# Patient Record
Sex: Female | Born: 1985 | Race: White | Hispanic: Yes | Smoking: Former smoker
Health system: Southern US, Community
[De-identification: ages and names within clinical notes are randomized; demographics above are authoritative.]

## PROBLEM LIST (undated history)

## (undated) ENCOUNTER — Emergency Department (HOSPITAL_COMMUNITY): Admission: EM | Payer: Self-pay

## (undated) DIAGNOSIS — R87629 Unspecified abnormal cytological findings in specimens from vagina: Secondary | ICD-10-CM

## (undated) HISTORY — PX: ECTOPIC PREGNANCY SURGERY: SHX613

## (undated) HISTORY — DX: Unspecified abnormal cytological findings in specimens from vagina: R87.629

---

## 2008-04-14 ENCOUNTER — Ambulatory Visit: Payer: Self-pay | Admitting: Internal Medicine

## 2008-08-03 ENCOUNTER — Emergency Department: Payer: Self-pay | Admitting: Emergency Medicine

## 2013-12-19 NOTE — L&D Delivery Note (Signed)
Delivery Note At 1:47 AM a viable female was delivered via Vaginal, Spontaneous Delivery (Presentation: Right Occiput Anterior).  APGAR: 8, ; weight pending .   Placenta status: Intact, Spontaneous.  Cord: 3 vessels with the following complications: None.  Cord pH: n/a  Anesthesia: Epidural  Episiotomy: None Lacerations: None Est. Blood Loss (mL): 250  Mom to postpartum.  Baby to Couplet care / Skin to Skin.  Jacquiline Doearker, Caleb 10/01/2014, 2:27 AM  Patient is 28 y.o. [id number] 6228w1d admitted with SOL  I was gloved and present for delivery in its entirety.  Second stage of labor progressed, baby delivered after few contractions.  Marked variability with few decels during second stage noted.  Complications: none  Lacerations: none  EBL: 250mL  Shyheem Whitham ROCIO, MD 2:28 AM

## 2014-01-20 ENCOUNTER — Ambulatory Visit (HOSPITAL_COMMUNITY)
Admission: RE | Admit: 2014-01-20 | Discharge: 2014-01-20 | Disposition: A | Discharge status: Home / Self Care | Payer: Self-pay | Source: Ambulatory Visit | Attending: Family Medicine | Admitting: Family Medicine

## 2014-01-20 ENCOUNTER — Other Ambulatory Visit (HOSPITAL_COMMUNITY): Payer: Self-pay | Admitting: Family Medicine

## 2014-01-20 DIAGNOSIS — O009 Unspecified ectopic pregnancy without intrauterine pregnancy: Secondary | ICD-10-CM

## 2014-01-20 DIAGNOSIS — O208 Other hemorrhage in early pregnancy: Secondary | ICD-10-CM | POA: Insufficient documentation

## 2014-01-20 DIAGNOSIS — Z3689 Encounter for other specified antenatal screening: Secondary | ICD-10-CM | POA: Insufficient documentation

## 2014-01-27 ENCOUNTER — Other Ambulatory Visit (HOSPITAL_COMMUNITY): Payer: Self-pay | Admitting: Family Medicine

## 2014-01-27 DIAGNOSIS — Z331 Pregnant state, incidental: Secondary | ICD-10-CM

## 2014-02-03 ENCOUNTER — Ambulatory Visit (HOSPITAL_COMMUNITY)
Admission: RE | Admit: 2014-02-03 | Discharge: 2014-02-03 | Disposition: A | Discharge status: Home / Self Care | Payer: Self-pay | Source: Ambulatory Visit | Attending: Family Medicine | Admitting: Family Medicine

## 2014-02-03 DIAGNOSIS — O208 Other hemorrhage in early pregnancy: Secondary | ICD-10-CM | POA: Insufficient documentation

## 2014-02-03 DIAGNOSIS — O3680X0 Pregnancy with inconclusive fetal viability, not applicable or unspecified: Secondary | ICD-10-CM | POA: Insufficient documentation

## 2014-02-03 DIAGNOSIS — Z331 Pregnant state, incidental: Secondary | ICD-10-CM

## 2014-02-03 DIAGNOSIS — Z3689 Encounter for other specified antenatal screening: Secondary | ICD-10-CM | POA: Insufficient documentation

## 2014-04-02 ENCOUNTER — Ambulatory Visit (INDEPENDENT_AMBULATORY_CARE_PROVIDER_SITE_OTHER): Payer: Self-pay | Admitting: Obstetrics and Gynecology

## 2014-04-02 ENCOUNTER — Encounter: Payer: Self-pay | Admitting: Obstetrics and Gynecology

## 2014-04-02 VITALS — BP 101/72 | Temp 97.8°F | Ht 60.0 in | Wt 121.8 lb

## 2014-04-02 DIAGNOSIS — O2340 Unspecified infection of urinary tract in pregnancy, unspecified trimester: Secondary | ICD-10-CM

## 2014-04-02 DIAGNOSIS — Z3480 Encounter for supervision of other normal pregnancy, unspecified trimester: Secondary | ICD-10-CM | POA: Insufficient documentation

## 2014-04-02 DIAGNOSIS — O2390 Unspecified genitourinary tract infection in pregnancy, unspecified trimester: Secondary | ICD-10-CM

## 2014-04-02 DIAGNOSIS — N390 Urinary tract infection, site not specified: Secondary | ICD-10-CM

## 2014-04-02 LAB — POCT URINALYSIS DIP (DEVICE)
BILIRUBIN URINE: NEGATIVE
Glucose, UA: NEGATIVE mg/dL
Ketones, ur: NEGATIVE mg/dL
NITRITE: NEGATIVE
PH: 7 (ref 5.0–8.0)
PROTEIN: NEGATIVE mg/dL
Specific Gravity, Urine: 1.01 (ref 1.005–1.030)
Urobilinogen, UA: 0.2 mg/dL (ref 0.0–1.0)

## 2014-04-02 MED ORDER — FLUCONAZOLE 150 MG PO TABS: 150.0000 mg | ORAL_TABLET | Freq: Once | ORAL | Status: DC

## 2014-04-02 MED ORDER — PRENATAL PLUS 27-1 MG PO TABS: 1.0000 | ORAL_TABLET | Freq: Every day | ORAL | Status: DC

## 2014-04-02 NOTE — Progress Notes (Signed)
Will treat presumptively for yeast.   Subjective:    [name] [name] is a [id number] 577w1d being seen today for her first obstetrical visit.  Her obstetrical history is significant for NSVD x 3. Patient does intend to breast feed. Pregnancy history fully reviewed.  Patient reports bleeding, nausea, vaginal irritation and H/As, partial relief with Tylenol.  Anxious at night. Ceasar Mons.  Filed Vitals:   04/02/14 0909 04/02/14 0919  BP: 101/72   Temp: 97.8 F (36.6 C)   Height:  5' (1.524 m)  Weight: 121 lb 12.8 oz (55.248 kg)     HISTORY: OB History  Gravida Para Term Preterm AB SAB TAB Ectopic Multiple Living  6 3 3  2  0 1 1  3     # Outcome Date GA Lbr Len/2nd Weight Sex Delivery Anes PTL Lv  6 CUR           5 TAB 02/2013          4 ECT 12/15/12          3 TRM 05/11/07 4349w4d  7 lb 7 oz (3.374 kg) F SVD EPI  Y  2 TRM 10/08/03 7183w0d  5 lb 5 oz (2.41 kg) F SVD None  Y  1 TRM 04/10/02 2383w0d  7 lb (3.175 kg) M SVD None  Y     Past Medical History  Diagnosis Date  . Vaginal Pap smear, abnormal    Past Surgical History  Procedure Laterality Date  . Ectopic pregnancy surgery     History reviewed. No pertinent family history.   Exam    Uterus:   S=D  Pelvic Exam:    Perineum: Normal Perineum   Vulva: normal, Bartholin's, Urethra, Skene's normal   Vagina:  normal mucosa, normal discharge, no blood. WP sent       Cervix: multiparous appearance, no bleeding following Pap and no lesions   Adnexa: not evaluated   Bony Pelvis: average  System: Breast:  normal appearance, no masses or tenderness   Skin: normal coloration and turgor, no rashes    Neurologic: oriented, normal, grossly non-focal   Extremities: normal strength, tone, and muscle mass   HEENT PERRLA and extra ocular movement intact   Mouth/Teeth mucous membranes moist, pharynx normal without lesions   Neck supple and no masses   Cardiovascular: regular rate and rhythm, no murmurs or gallops   Respiratory:  appears well,  vitals normal, no respiratory distress, acyanotic, normal RR, ear and throat exam is normal, neck free of mass or lymphadenopathy, chest clear, no wheezing, crepitations, rhonchi, normal symmetric air entry   Abdomen: soft, non-tender; bowel sounds normal; no masses,  no organomegaly   Urinary: urethral meatus normal      Assessment:    Pregnancy: [id number] Patient Active Problem List   Diagnosis Date Noted  . Pregnancy, normal subsequent 04/02/2014  Language barrier Tension H/As and anxiety      Plan:     Initial labs drawn. Prenatal vitamins. Diflucan rx Problem list reviewed and updated. Genetic Screening discussed Quad Screen: ordered.  Ultrasound discussed; fetal survey: ordered.  Follow up in 4 weeks. 50% of 30 min visit spent on counseling and coordination of care.  Raynelle FanningJulie for interpretation  Advised take up to 1000 mg Tylenol up to 4 gm per day. Frequent meals and fluids, afternoon exercise, [facility] if H/As persist. Danae Orleanseirdre C Markelle Najarian 04/02/2014

## 2014-04-02 NOTE — Patient Instructions (Addendum)
Segundo trimestre del embarazo  (Second Trimester of Pregnancy) El segundo trimestre del embarazo se extiende desde la semana 13 hasta la semana 28, del 4 al 6 mes. En general, es el momento del embarazo en el que se sentir mejor. Su organismo se ha adaptado a estar embarazada y comienza a sentirse fsicamente mejor. En general las nuseas matutinas han disminuido o han desaparecido completamente. El segundo trimestre es tambin la poca en la que el feto se desarrolla rpidamente. Hacia el final del sexto mes, el beb mide aproximadamente 9 pulgadas (23 cm) y pesa alrededor de 1  libras (700 g). Es probable que sienta mover al beb (dar pataditas) entre las 18 y 20 semanas del embarazo.  CAMBIOS CORPORALES  Su organismo atravesar numerosos cambios durante el embarazo. Los cambios varan de una mujer a otra.   Seguir aumentando de peso. Notar que la parte baja del abdomen se ensancha.  Podrn aparecer las primeras estras en las caderas, abdomen y mamas.  Es posible que sienta cefaleas, que se pueden aliviar con los medicamentos que su mdico le autorice a utilizar.  Tendr necesidad de orinar con ms frecuencia porque el feto est presionando en la vejiga.  Como consecuencia del embarazo, podr sentir acidez estomacal continuamente.  Podr estar constipada ya que ciertas hormonas hacen que los msculos que empujan los desechos a travs de los intestinos trabajen ms lentamente.  Pueden aparecer hemorroides o abultarse las venas (venas varicosas).  El dolor de espalda se debe al aumento de peso y a que las hormonas del embarazo relajan las articulaciones entre los huesos de la pelvis y a la modificacin del peso y a los msculos que soportan el equilibrio.  Sus mamas seguirn desarrollndose y estarn ms sensibles.  Las encas pueden sangrar y estar sensibles al cepillado y al hilo dental.  Pueden aparecer en el rostro zonas oscuras o manchas (cloasma, mscara del embarazo). Esto  desaparece despus del nacimiento del beb.  Es posible que se formeuna lnea oscura desde el ombligo a la zona del pubis (linea nigra). Esto desaparece despus del nacimiento del beb. QU DEBE ESPERAR EN LAS CONSULTAS PRENATALES  Durante una visita prenatal de rutina:   La pesarn para verificar que usted y el feto se encuentran dentro de los lmites normales.  Le tomarn la presin arterial.  Le medirn el abdomen para verificar el desarrollo del beb.  Escucharn los latidos fetales.  Se evaluarn los resultados de los estudios realizados en visitas anteriores. El mdico le preguntar:   Cmo se siente.  Si siente los movimientos del beb.  Si tiene sntomas anormales, como prdida de lquido, sangrado, dolores de cabeza intensos o clicos abdominales.  Si tiene alguna duda. Otros estudios que podrn realizarse durante el segundo trimestre son:   Anlisis de sangre para evaluar:  Niveles bajos de hierro (anemia).  Diabetes gestacional (entre las 24 y las 28 semanas).  Anticuerpos Rh.  Anlisis de orina para detectar infecciones, diabetes o protenas en la orina.  Una ecografa para confirmar si el crecimiento y el desarrollo del beb son los adecuados.  Una amniocentesis para diagnosticar posibles problemas genticos.  Estudios del feto para descartar espina bfida y sndrome de Down. INSTRUCCIONES PARA EL CUIDADO EN EL HOGAR   Evite fumar, consumir hierbas, beber alcohol y utilizar frmacos que no ne hayan recetado. Estas sustancias qumicas afectan la formacin y el desarrollo del beb.  Siga las indicaciones del profesional con respecto a como tomar los medicamentos. Durante el embarazo, hay   medicamentos que son seguros y otros no lo son.  Realice actividad fsica slo segn las indicaciones del mdico. Sentir clicos uterinos es el mejor signo para Restaurant manager, fast fooddetener la actividad fsica.  Contine haciendo comidas regulares y sanas.  Use un sostn que le brinde buen  soporte si sus mamas estn sensibles.  No utilice la baera con agua caliente, baos turcos y saunas.  Colquese el cinturn de seguridad cuando conduzca.  Evite comer carne cruda y el contacto con los utensilios y desperdicios de los gatos. Estos elementos contienen grmenes que pueden causar defectos de nacimiento en el beb.  Tome las vitaminas indicadas para la etapa prenatal.  Pruebe un laxante (si el mdico la autoriza) si tiene constipacin. Consuma ms alimentos ricos en fibra, como vegetales y frutas frescos y cereales enteros. Beba gran cantidad de lquido para mantener la orina de tono claro o amarillo plido.  Tome baos de agua tibia para Primary school teachercalmar el dolor o las molestias causadas por las hemorroides. Use una crema para las hemorroides si el mdico la autoriza.  Si tiene venas varicosas, use medias de soporte. Eleve los pies durante 15 minutos, 3 o 4 veces por da. Limite el consumo de sal en su dieta.  Evite levantar objetos pesados, use zapatos de tacones bajos y [place]enga una buena postura.  Descanse con las piernas elevadas si tiene calambres o dolor de cintura.  Visite a su dentista si no lo ha Occupational hygienisthecho durante el embarazo. Use un cepillo de dientes blando para higienizarse los dientes y use suavemente el hilo dental.  Puede continuar su vida sexual siempre que el mdico la autorice.  Concurra a todas las visitas prenatales segn las indicaciones de su mdico. SOLICITE ATENCIN MDICA SI:   Santa Generaiene mareos.  Siente clicos leves, presin en la pelvis o dolor persistente en el abdomen.  Tiene nuseas o vmitos o diarrea persistentes.  Brett Fairybserva una secrecin vaginal con mal olor.  Siente dolor al ConocoPhillipsorinar. SOLICITE ATENCIN MDICA DE INMEDIATO SI:   Tiene fiebre.  Pierde lquido por la vagina.  Tiene sangrando o pequeas prdidas vaginales.  Siente dolor intenso o clicos en el abdomen.  Sube o baja de peso rpidamente.  Tiene dificultad para respirar y Science writerle duele  pecho.  Sbitamente se le hincha el rostro, las manos, los tobillos, los pies o las piernas de Spring[place]manera extrema.  No ha sentido los movimientos del beb durante Georgianne Fickuna hora.  Siente un dolor de cabeza intenso que no se alivia con medicamentos.  Su visin se modifica. Document Released: 09/14/2005 Document Revised: 08/07/2013 [facility] Patient Information 2014 [place]ExitCare, MarylandLLC. Trastorno de ansiedad social  (Social Anxiety Disorder) El trastorno de ansiedad social, antes llamado fobia social, es una enfermedad mental. Las personas que sufren este trastorno se sienten nerviosos cuando estn con otras personas en situaciones sociales. Sienten una preocupacin constante acerca de ser juzgados o criticados por su aspecto, lo que dicen o cmo actan. Les preocupa que otras personas puedan Information systems managerrechazarlos debido a su apariencia o a su conducta.  El trastorno de ansiedad social es ms que slo timidez o inseguridad ocasional. Puede causar intenso estrs emocional. Puede interferir con las actividades de la vida diaria. Tambin puede conducir al consumo excesivo de alcohol o drogas y an al suicidio.  Es en la actualidad uno de los trastornos mentales ms comunes. Puede aparecer en cualquier momento pero generalmente se inicia en la adolescencia. La mujeres se ven ms afectadas que los hombres. El trastorno de ansiedad social es tambin ms frecuente en  personas que tienen familiares con el mismo trastorno de ansiedad. Tambin es ms frecuente en personas que tienen deformidades o enfermedades fsicas con caractersticas evidentes en otros, como tartamudez o movimientos anormales (enfermedad de Parkinson).  SNTOMAS  Adems de sentirse ansioso o temeroso en [employer], las personas que sufren este trastorno tienen con frecuencia sntomas fsicos. Por ejemplo:   Enrojecimiento del rostro (rubor).  Frecuencia cardaca acelerada.  Sudoracin.  Manos o voz  temblorosa.  Confusin.  Aturdimiento.  Malestar estomacal y [place]. DIAGNSTICO  El mdico es el encargado de diagnosticar el trastorno de ansiedad social por medio de una evaluacin. Le har preguntas acerca de su estado de nimo, pensamientos y Oncologist. Evaluar su historia clnica y el consumo de alcohol o drogas, incluyendo medicamentos recetados. Ciertas enfermedades mdicas y el uso de algunas sustancias, incluyendo la [place] causar sntomas similares al trastorno de ansiedad social. El mdico podr derivarlo a un Environmental manager salud mental para que realice una evaluacin o un tratamiento.  Los criterios para el diagnstico son:   Water engineer o ansiedad en una o ms situaciones sociales en las que podra ser muy observado o estudiado por Economist. Algunos ejemplos de esas situaciones son:  Ship broker (tener conversaciones con Economist, ir a [place], reunirse con desconocidos).  Ser observado (comer o beber en pblico o ser llamado al frente en Neomia Dear clase).  Exponer frente a otros (dar una conferencia).  Las [employer] que lo preocupan casi siempre causan temor o ansiedad, no slo ocasionalmente.  Las personas que sufren este trastorno temen ser juzgadas de [place] negativa de modo vergonzante, causar Dispensing optician u ofender a los dems. El temor asociado al trastorno de ansiedad nunca est en proporcin con la amenaza verdadera que provoca la situacin social.  [place] las situaciones sociales que desencadenan el problema son evitadas o se enfrentan con intenso temor o ansiedad. El miedo, la ansiedad o las conductas de evitacin son persistentes y duran por 6 meses o ms.  La ansiedad causa dificultad en el funcionamiento en al menos algunos aspectos de la vida diaria. TRATAMIENTO  Hay varios tipos de tratamiento disponibles para el trastorno de ansiedad social. Estos tratamientos generalmente se usan en  combinacin e incluyen:   Psicoterapia. La psicoterapia de [employer] ver que no est solo con esos problemas. La psicoterapia individual lo ayudar a enfrentar sus temas especficos de ansiedad bajo el cuidado de un profesional. Las formas ms efectivas de psicoterapia para el trastorno de ansiedad social son la terapia cognitivo-conductual y la terapia de exposicin. La terapia cognitivo-conductual ayuda a identificar y [place] pensamientos y creencias negativos que son la raz del trastorno. La terapia de exposicin le permite enfrentar gradualmente las situaciones ms temidas.  Tcnicas de relajacin y enfrentamiento. Incluyen respiracin profunda, reflexin, meditacin, visualizacin y yoga. Las tcnicas de relajacin ayudan a Pharmacologist la calma en situaciones sociales.  Entrenamiento de las [address]Las [employer] pueden ser aprendidas con ayuda de un terapeuta. l podr ayudarlo a sentirse ms confiado y cmodo en las [employer].  Medicamentos. Para la ansiedad limitada a situaciones de desempeo (ansiedad de desempeo), los medicamentos llamados betabloqueantes pueden ayudar a reducir o prevenir los sntomas fsicos del trastorno de ansiedad social. En caso de ansiedad social ms persistente y Equities trader, los antidepresivos pueden indicarse para ayudar a Chief Operating Officer los sntomas En los casos graves de trastorno de ansiedad social, unos medicamentos para la ansiedad muy fuertes, llamados benzodiazepinas, pueden recetarse en  situaciones limitadas y por un breve tiempo. Document Released: 08/07/2013 [facility] Patient Information 2014 [place]ExitCare, MarylandLLC.

## 2014-04-02 NOTE — Progress Notes (Signed)
Pulse- 85 Pain-"cramping"  Vaginal discharge-brownish but it going away with itching Pt reports having palpations at night "maybe anxiousness"; constant headaches in the am, a lot of dizziness and nausea Weight gain 25-35lbs Declined flu vaccine

## 2014-04-03 LAB — OBSTETRIC PANEL
Antibody Screen: NEGATIVE
Basophils Absolute: 0 10*3/uL (ref 0.0–0.1)
Basophils Relative: 0 % (ref 0–1)
EOS ABS: 0.1 10*3/uL (ref 0.0–0.7)
Eosinophils Relative: 1 % (ref 0–5)
HEMATOCRIT: 39.3 % (ref 36.0–46.0)
HEMOGLOBIN: 13.7 g/dL (ref 12.0–15.0)
Hepatitis B Surface Ag: NEGATIVE
Lymphocytes Relative: 20 % (ref 12–46)
Lymphs Abs: 1.7 10*3/uL (ref 0.7–4.0)
MCH: 31.5 pg (ref 26.0–34.0)
MCHC: 34.9 g/dL (ref 30.0–36.0)
MCV: 90.3 fL (ref 78.0–100.0)
MONOS PCT: 5 % (ref 3–12)
Monocytes Absolute: 0.4 10*3/uL (ref 0.1–1.0)
Neutro Abs: 6.4 10*3/uL (ref 1.7–7.7)
Neutrophils Relative %: 74 % (ref 43–77)
Platelets: 257 10*3/uL (ref 150–400)
RBC: 4.35 MIL/uL (ref 3.87–5.11)
RDW: 14 % (ref 11.5–15.5)
RH TYPE: POSITIVE
Rubella: 2.34 Index — ABNORMAL HIGH (ref <0.90)
WBC: 8.7 10*3/uL (ref 4.0–10.5)

## 2014-04-03 LAB — AFP TUMOR MARKER: AFP TUMOR MARKER: 33.2 ng/mL — AB (ref 0.0–8.0)

## 2014-04-03 LAB — HIV ANTIBODY (ROUTINE TESTING W REFLEX): HIV 1&2 Ab, 4th Generation: NONREACTIVE

## 2014-04-04 LAB — PRESCRIPTION MONITORING PROFILE (19 PANEL)
Amphetamine/Meth: NEGATIVE ng/mL
Barbiturate Screen, Urine: NEGATIVE ng/mL
Benzodiazepine Screen, Urine: NEGATIVE ng/mL
Buprenorphine, Urine: NEGATIVE ng/mL
CARISOPRODOL, URINE: NEGATIVE ng/mL
CREATININE, URINE: 20.11 mg/dL (ref >20.0)
Cannabinoid Scrn, Ur: NEGATIVE ng/mL
Cocaine Metabolites: NEGATIVE ng/mL
Fentanyl, Ur: NEGATIVE ng/mL
MDMA URINE: NEGATIVE ng/mL
MEPERIDINE UR: NEGATIVE ng/mL
METHADONE SCREEN, URINE: NEGATIVE ng/mL
Methaqualone: NEGATIVE ng/mL
NITRITES URINE, INITIAL: NEGATIVE ug/mL
Opiate Screen, Urine: NEGATIVE ng/mL
Oxycodone Screen, Ur: NEGATIVE ng/mL
PH URINE, INITIAL: 7 pH (ref 4.5–8.9)
PROPOXYPHENE: NEGATIVE ng/mL
Phencyclidine, Ur: NEGATIVE ng/mL
Tapentadol, urine: NEGATIVE ng/mL
Tramadol Scrn, Ur: NEGATIVE ng/mL
Zolpidem, Urine: NEGATIVE ng/mL

## 2014-04-04 LAB — HEMOGLOBINOPATHY EVALUATION
HEMOGLOBIN OTHER: 0 %
HGB A2 QUANT: 2.9 % (ref 2.2–3.2)
Hgb A: 96.8 % (ref 96.8–97.8)
Hgb F Quant: 0.3 % (ref 0.0–2.0)
Hgb S Quant: 0 %

## 2014-04-06 LAB — CULTURE, OB URINE

## 2014-04-09 ENCOUNTER — Telehealth: Payer: Self-pay | Admitting: General Practice

## 2014-04-09 DIAGNOSIS — O2340 Unspecified infection of urinary tract in pregnancy, unspecified trimester: Secondary | ICD-10-CM | POA: Insufficient documentation

## 2014-04-09 DIAGNOSIS — O2341 Unspecified infection of urinary tract in pregnancy, first trimester: Secondary | ICD-10-CM

## 2014-04-09 MED ORDER — CEPHALEXIN 500 MG PO CAPS: 500.0000 mg | ORAL_CAPSULE | Freq: Four times a day (QID) | ORAL | Status: DC

## 2014-04-09 NOTE — Telephone Encounter (Signed)
Called patient with pacific interpreter [phone] and informed her of UTI and antibiotic available for pickup. Patient verbalized understanding and had no further questions

## 2014-04-09 NOTE — Telephone Encounter (Signed)
Message copied by St. John OwassoILLMAN, Laury AxonARRIE L on Wed Apr 09, 2014 11:07 AM ------      Message from: POE, DEIRDRE C      Created: Wed Apr 09, 2014 11:02 AM       RX Keflex 500 qid x7d NR ------

## 2014-04-14 ENCOUNTER — Telehealth: Payer: Self-pay | Admitting: *Deleted

## 2014-04-14 NOTE — Telephone Encounter (Signed)
Called [name] [name] with Interpreter Raynelle FanningJulie and explained how to take the Cephalexin , and what it is for. All questions answered .  [name] [name] voices understanding.

## 2014-04-14 NOTE — Telephone Encounter (Signed)
[name] [name] called and left a message stating she needed to know how to take the medicine.

## 2014-04-29 ENCOUNTER — Encounter: Payer: Self-pay | Admitting: Advanced Practice Midwife

## 2014-04-29 ENCOUNTER — Ambulatory Visit (INDEPENDENT_AMBULATORY_CARE_PROVIDER_SITE_OTHER): Payer: Self-pay | Admitting: Advanced Practice Midwife

## 2014-04-29 VITALS — BP 104/71 | HR 85 | Temp 98.2°F | Wt 128.1 lb

## 2014-04-29 DIAGNOSIS — Z3480 Encounter for supervision of other normal pregnancy, unspecified trimester: Secondary | ICD-10-CM

## 2014-04-29 LAB — POCT URINALYSIS DIP (DEVICE)
Bilirubin Urine: NEGATIVE
Glucose, UA: NEGATIVE mg/dL
HGB URINE DIPSTICK: NEGATIVE
KETONES UR: NEGATIVE mg/dL
Nitrite: NEGATIVE
PH: 7.5 (ref 5.0–8.0)
PROTEIN: NEGATIVE mg/dL
Specific Gravity, Urine: 1.02 (ref 1.005–1.030)
UROBILINOGEN UA: 0.2 mg/dL (ref 0.0–1.0)

## 2014-04-29 NOTE — Progress Notes (Signed)
Doing well. Took all of her medicine for UTI. Reviewed hygeine. Was wiping back to front. Has US appt next week

## 2014-04-29 NOTE — Patient Instructions (Signed)
Second Trimester of Pregnancy The second trimester is from week 13 through week 28, months 4 through 6. The second trimester is often a time when you feel your best. Your body has also adjusted to being pregnant, and you begin to feel better physically. Usually, morning sickness has lessened or quit completely, you may have more energy, and you may have an increase in appetite. The second trimester is also a time when the fetus is growing rapidly. At the end of the sixth month, the fetus is about 9 inches long and weighs about 1 pounds. You will likely begin to feel the baby move (quickening) between 18 and 20 weeks of the pregnancy. BODY CHANGES Your body goes through many changes during pregnancy. The changes vary from woman to woman.   Your weight will continue to increase. You will notice your lower abdomen bulging out.  You may begin to get stretch marks on your hips, abdomen, and breasts.  You may develop headaches that can be relieved by medicines approved by your caregiver.  You may urinate more often because the fetus is pressing on your bladder.  You may develop or continue to have heartburn as a result of your pregnancy.  You may develop constipation because certain hormones are causing the muscles that push waste through your intestines to slow down.  You may develop hemorrhoids or swollen, bulging veins (varicose veins).  You may have back pain because of the weight gain and pregnancy hormones relaxing your joints between the bones in your pelvis and as a result of a shift in weight and the muscles that support your balance.  Your breasts will continue to grow and be tender.  Your gums may bleed and may be sensitive to brushing and flossing.  Dark spots or blotches (chloasma, mask of pregnancy) may develop on your face. This will likely fade after the baby is born.  A dark line from your belly button to the pubic area (linea nigra) may appear. This will likely fade after the  baby is born. WHAT TO EXPECT AT YOUR PRENATAL VISITS During a routine prenatal visit:  You will be weighed to make sure you and the fetus are growing normally.  Your blood pressure will be taken.  Your abdomen will be measured to track your baby's growth.  The fetal heartbeat will be listened to.  Any test results from the previous visit will be discussed. Your caregiver may ask you:  How you are feeling.  If you are feeling the baby move.  If you have had any abnormal symptoms, such as leaking fluid, bleeding, severe headaches, or abdominal cramping.  If you have any questions. Other tests that may be performed during your second trimester include:  Blood tests that check for:  Low iron levels (anemia).  Gestational diabetes (between 24 and 28 weeks).  Rh antibodies.  Urine tests to check for infections, diabetes, or protein in the urine.  An ultrasound to confirm the proper growth and development of the baby.  An amniocentesis to check for possible genetic problems.  Fetal screens for spina bifida and Down syndrome. HOME CARE INSTRUCTIONS   Avoid all smoking, herbs, alcohol, and unprescribed drugs. These chemicals affect the formation and growth of the baby.  Follow your caregiver's instructions regarding medicine use. There are medicines that are either safe or unsafe to take during pregnancy.  Exercise only as directed by your caregiver. Experiencing uterine cramps is a good sign to stop exercising.  Continue to eat regular,   healthy meals.  Wear a good support bra for breast tenderness.  Do not use hot tubs, steam rooms, or saunas.  Wear your seat belt at all times when driving.  Avoid raw meat, uncooked cheese, cat litter boxes, and soil used by cats. These carry germs that can cause birth defects in the baby.  Take your prenatal vitamins.  Try taking a stool softener (if your caregiver approves) if you develop constipation. Eat more high-fiber foods,  such as fresh vegetables or fruit and whole grains. Drink plenty of fluids to keep your urine clear or pale yellow.  Take warm sitz baths to soothe any pain or discomfort caused by hemorrhoids. Use hemorrhoid cream if your caregiver approves.  If you develop varicose veins, wear support hose. Elevate your feet for 15 minutes, 3 4 times a day. Limit salt in your diet.  Avoid heavy lifting, wear low heel shoes, and practice good posture.  Rest with your legs elevated if you have leg cramps or low back pain.  Visit your dentist if you have not gone yet during your pregnancy. Use a soft toothbrush to brush your teeth and be gentle when you floss.  A sexual relationship may be continued unless your caregiver directs you otherwise.  Continue to go to all your prenatal visits as directed by your caregiver. SEEK MEDICAL CARE IF:   You have dizziness.  You have mild pelvic cramps, pelvic pressure, or nagging pain in the abdominal area.  You have persistent nausea, vomiting, or diarrhea.  You have a bad smelling vaginal discharge.  You have pain with urination. SEEK IMMEDIATE MEDICAL CARE IF:   You have a fever.  You are leaking fluid from your vagina.  You have spotting or bleeding from your vagina.  You have severe abdominal cramping or pain.  You have rapid weight gain or loss.  You have shortness of breath with chest pain.  You notice sudden or extreme swelling of your face, hands, ankles, feet, or legs.  You have not felt your baby move in over an hour.  You have severe headaches that do not go away with medicine.  You have vision changes. Document Released: 11/29/2001 Document Revised: 08/07/2013 Document Reviewed: 02/05/2013 ExitCare Patient Information 2014 ExitCare, LLC.  

## 2014-04-29 NOTE — Progress Notes (Signed)
Patient reports difficulty sleeping due to congestion and reports occasional cramps

## 2014-05-05 ENCOUNTER — Ambulatory Visit (HOSPITAL_COMMUNITY)
Admission: RE | Admit: 2014-05-05 | Discharge: 2014-05-05 | Disposition: A | Discharge status: Home / Self Care | Payer: Medicaid Other | Source: Ambulatory Visit | Attending: Obstetrics and Gynecology | Admitting: Obstetrics and Gynecology

## 2014-05-05 DIAGNOSIS — Z3480 Encounter for supervision of other normal pregnancy, unspecified trimester: Secondary | ICD-10-CM

## 2014-05-05 DIAGNOSIS — Z3689 Encounter for other specified antenatal screening: Secondary | ICD-10-CM | POA: Insufficient documentation

## 2014-05-27 ENCOUNTER — Ambulatory Visit (INDEPENDENT_AMBULATORY_CARE_PROVIDER_SITE_OTHER): Payer: Self-pay | Admitting: Advanced Practice Midwife

## 2014-05-27 VITALS — BP 108/73 | HR 98 | Temp 96.9°F | Wt 133.5 lb

## 2014-05-27 DIAGNOSIS — Z3480 Encounter for supervision of other normal pregnancy, unspecified trimester: Secondary | ICD-10-CM

## 2014-05-27 DIAGNOSIS — N390 Urinary tract infection, site not specified: Secondary | ICD-10-CM

## 2014-05-27 DIAGNOSIS — O2390 Unspecified genitourinary tract infection in pregnancy, unspecified trimester: Secondary | ICD-10-CM

## 2014-05-27 DIAGNOSIS — O2340 Unspecified infection of urinary tract in pregnancy, unspecified trimester: Secondary | ICD-10-CM

## 2014-05-27 LAB — POCT URINALYSIS DIP (DEVICE)
Bilirubin Urine: NEGATIVE
GLUCOSE, UA: NEGATIVE mg/dL
Leukocytes, UA: NEGATIVE
NITRITE: NEGATIVE
PH: 6.5 (ref 5.0–8.0)
PROTEIN: NEGATIVE mg/dL
Specific Gravity, Urine: 1.02 (ref 1.005–1.030)
Urobilinogen, UA: 0.2 mg/dL (ref 0.0–1.0)

## 2014-05-27 NOTE — Patient Instructions (Signed)
Second Trimester of Pregnancy The second trimester is from week 13 through week 28, months 4 through 6. The second trimester is often a time when you feel your best. Your body has also adjusted to being pregnant, and you begin to feel better physically. Usually, morning sickness has lessened or quit completely, you may have more energy, and you may have an increase in appetite. The second trimester is also a time when the fetus is growing rapidly. At the end of the sixth month, the fetus is about 9 inches long and weighs about 1 pounds. You will likely begin to feel the baby move (quickening) between 18 and 20 weeks of the pregnancy. BODY CHANGES Your body goes through many changes during pregnancy. The changes vary from woman to woman.   Your weight will continue to increase. You will notice your lower abdomen bulging out.  You may begin to get stretch marks on your hips, abdomen, and breasts.  You may develop headaches that can be relieved by medicines approved by your caregiver.  You may urinate more often because the fetus is pressing on your bladder.  You may develop or continue to have heartburn as a result of your pregnancy.  You may develop constipation because certain hormones are causing the muscles that push waste through your intestines to slow down.  You may develop hemorrhoids or swollen, bulging veins (varicose veins).  You may have back pain because of the weight gain and pregnancy hormones relaxing your joints between the bones in your pelvis and as a result of a shift in weight and the muscles that support your balance.  Your breasts will continue to grow and be tender.  Your gums may bleed and may be sensitive to brushing and flossing.  Dark spots or blotches (chloasma, mask of pregnancy) may develop on your face. This will likely fade after the baby is born.  A dark line from your belly button to the pubic area (linea nigra) may appear. This will likely fade after the  baby is born. WHAT TO EXPECT AT YOUR PRENATAL VISITS During a routine prenatal visit:  You will be weighed to make sure you and the fetus are growing normally.  Your blood pressure will be taken.  Your abdomen will be measured to track your baby's growth.  The fetal heartbeat will be listened to.  Any test results from the previous visit will be discussed. Your caregiver may ask you:  How you are feeling.  If you are feeling the baby move.  If you have had any abnormal symptoms, such as leaking fluid, bleeding, severe headaches, or abdominal cramping.  If you have any questions. Other tests that may be performed during your second trimester include:  Blood tests that check for:  Low iron levels (anemia).  Gestational diabetes (between 24 and 28 weeks).  Rh antibodies.  Urine tests to check for infections, diabetes, or protein in the urine.  An ultrasound to confirm the proper growth and development of the baby.  An amniocentesis to check for possible genetic problems.  Fetal screens for spina bifida and Down syndrome. HOME CARE INSTRUCTIONS   Avoid all smoking, herbs, alcohol, and unprescribed drugs. These chemicals affect the formation and growth of the baby.  Follow your caregiver's instructions regarding medicine use. There are medicines that are either safe or unsafe to take during pregnancy.  Exercise only as directed by your caregiver. Experiencing uterine cramps is a good sign to stop exercising.  Continue to eat regular,   healthy meals.  Wear a good support bra for breast tenderness.  Do not use hot tubs, steam rooms, or saunas.  Wear your seat belt at all times when driving.  Avoid raw meat, uncooked cheese, cat litter boxes, and soil used by cats. These carry germs that can cause birth defects in the baby.  Take your prenatal vitamins.  Try taking a stool softener (if your caregiver approves) if you develop constipation. Eat more high-fiber foods,  such as fresh vegetables or fruit and whole grains. Drink plenty of fluids to keep your urine clear or pale yellow.  Take warm sitz baths to soothe any pain or discomfort caused by hemorrhoids. Use hemorrhoid cream if your caregiver approves.  If you develop varicose veins, wear support hose. Elevate your feet for 15 minutes, 3 4 times a day. Limit salt in your diet.  Avoid heavy lifting, wear low heel shoes, and practice good posture.  Rest with your legs elevated if you have leg cramps or low back pain.  Visit your dentist if you have not gone yet during your pregnancy. Use a soft toothbrush to brush your teeth and be gentle when you floss.  A sexual relationship may be continued unless your caregiver directs you otherwise.  Continue to go to all your prenatal visits as directed by your caregiver. SEEK MEDICAL CARE IF:   You have dizziness.  You have mild pelvic cramps, pelvic pressure, or nagging pain in the abdominal area.  You have persistent nausea, vomiting, or diarrhea.  You have a bad smelling vaginal discharge.  You have pain with urination. SEEK IMMEDIATE MEDICAL CARE IF:   You have a fever.  You are leaking fluid from your vagina.  You have spotting or bleeding from your vagina.  You have severe abdominal cramping or pain.  You have rapid weight gain or loss.  You have shortness of breath with chest pain.  You notice sudden or extreme swelling of your face, hands, ankles, feet, or legs.  You have not felt your baby move in over an hour.  You have severe headaches that do not go away with medicine.  You have vision changes. Document Released: 11/29/2001 Document Revised: 08/07/2013 Document Reviewed: 02/05/2013 ExitCare Patient Information 2014 ExitCare, LLC.  

## 2014-05-27 NOTE — Progress Notes (Signed)
Unhappy with "the doctor who did my ultrasound". Reviewed [place] in detail with pictures.  Pt asked about quad screen. Upon review, wrong test was ordered. Quad screen redone today.  K Rassette in to talk to patient.

## 2014-05-27 NOTE — Progress Notes (Signed)
Pain- lower abd 

## 2014-05-29 LAB — AFP, QUAD SCREEN
AFP: 53.2 [IU]/mL
Curr Gest Age: 23 wks.days
HCG TOTAL: 6690 m[IU]/mL
INH: 530.7 pg/mL
Interpretation-AFP: POSITIVE — AB
MOM FOR INH: 2.39
MoM for AFP: 0.65
MoM for hCG: 0.52
OPEN SPINA BIFIDA: NEGATIVE
Osb Risk: <1:27300 {titer}
Tri 18 Scr Risk Est: NEGATIVE
uE3 Mom: 0.69
uE3 Value: 1.9 ng/mL

## 2014-05-30 ENCOUNTER — Encounter (HOSPITAL_COMMUNITY): Payer: Self-pay | Admitting: Advanced Practice Midwife

## 2014-06-11 ENCOUNTER — Telehealth: Payer: Self-pay | Admitting: *Deleted

## 2014-06-11 DIAGNOSIS — O3510X1 Maternal care for (suspected) chromosomal abnormality in fetus, unspecified, fetus 1: Secondary | ICD-10-CM

## 2014-06-11 DIAGNOSIS — O351XX1 Maternal care for (suspected) chromosomal abnormality in fetus, fetus 1: Secondary | ICD-10-CM

## 2014-06-11 NOTE — Telephone Encounter (Signed)
Called pt with [employer] Interpreter # [phone]. Message left that I am calling with results information. Please call back. **Pt needs to be informed of abnormal Quad screen results (increased risk of Down's). She has been scheduled for Genetic Counseling appt @ [facility] on 7/1 @ 0900.

## 2014-06-12 ENCOUNTER — Encounter (HOSPITAL_COMMUNITY): Payer: Self-pay

## 2014-06-12 NOTE — Telephone Encounter (Signed)
Patient called in to front office, used Dori for interpreter- Informed patient of results and appt with [facility] 7/1 @ 9. Patient verbalized understanding and stated she has only been feeling her baby move 2 times a day and hasn't felt the baby move at all today. Told patient to lay down in a quite place and eat a snack and something to drink and if she hasn't felt the baby move in an hour to go to [facility] for further evaluation. Patient verbalized understanding and had no other questions

## 2014-06-18 ENCOUNTER — Ambulatory Visit (HOSPITAL_COMMUNITY)
Admission: RE | Admit: 2014-06-18 | Discharge: 2014-06-18 | Disposition: A | Discharge status: Home / Self Care | Payer: Self-pay | Source: Ambulatory Visit | Attending: Obstetrics and Gynecology | Admitting: Obstetrics and Gynecology

## 2014-06-18 DIAGNOSIS — O289 Unspecified abnormal findings on antenatal screening of mother: Secondary | ICD-10-CM

## 2014-06-18 NOTE — Progress Notes (Signed)
°  Genetic Counseling  DOB: [birth year] Referring Provider: Danae OrleansPoe, Deirdre C, CNM Appointment Date: 06/18/2014 Attending: Dr. Alpha GulaPaul Whitecar  Ms. Beatrize Everett GraffM Cauthorn and her husband,    Mr. Derrick RavelVidal Martinez, were seen for genetic counseling because of an increased risk for fetal Down syndrome based on a maternal serum Quad screen.  Spanish interpretation was provided.  They were counseled regarding the Quad screen result and the associated 1 in 258 risk for fetal Down syndrome.  We reviewed chromosomes, nondisjunction, and the common features and variable prognosis of Down syndrome.  In addition, we reviewed the screen adjusted reduction in risks for trisomy 18 and ONTDs.  We also discussed other explanations for a screen positive result including: a gestational dating error, differences in maternal metabolism, and normal variation. They understand that this screening is not diagnostic for Down syndrome but provides a risk assessment.  We reviewed available screening options including noninvasive prenatal screening (NIPS)/cell free fetal DNA (cffDNA) testing, and detailed ultrasound. They were counseled that screening tests are used to modify a patient's a priori risk for aneuploidy, typically based on age. This estimate provides a pregnancy specific risk assessment. We reviewed the benefits and limitations of each option. Specifically, we discussed the conditions for which each test screens, the detection rates, false positive rates and expense of each. They were also counseled regarding diagnostic testing via amniocentesis. We reviewed the approximate 1 in 300-500 risk for complications for amniocentesis, including spontaneous pregnancy loss or preterm delivery.   We also reviewed the results of her anatomy ultrasound previously performed.  At that time there were no visualized fetal anomalies or markers suggestive of aneuploidy. They were counseled that up to 50-80% of pregnancies with Down syndrome will have  fetal anomalies or soft markers of aneuploidy.  Diagnostic testing was declined today.  They understand that screening tests cannot rule out all birth defects or genetic syndromes. The patient was advised of this limitation and states she still does not want additional testing at this time.   Ms. Trisha MangleDiaz was provided with written information regarding sickle cell anemia (SCA) including the carrier frequency and incidence in the Hispanic population, the availability of carrier testing and prenatal diagnosis if indicated.  In addition, we discussed that hemoglobinopathies are routinely screened for as part of the [place] newborn screening panel.  She declined hemoglobin electrophoresis today.    Both family histories were reviewed and found to be noncontributory for birth defects, genetic conditions and intellectual disabilities. Without further information regarding the provided family history, an accurate genetic risk cannot be calculated. Further genetic counseling is warranted if more information is obtained.  Ms. Trisha MangleDiaz denied exposure to environmental toxins or chemical agents. She denied the use of alcohol, tobacco or street drugs. She denied significant viral illnesses during the course of her pregnancy. Her medical and surgical histories were noncontributory.   I counseled this couple for approximately 60 minutes regarding the above risks and available options.   Mady Gemmaaragh Conrad, MS  Certified Genetic Counselor

## 2014-06-19 ENCOUNTER — Other Ambulatory Visit: Payer: Self-pay | Admitting: Advanced Practice Midwife

## 2014-06-19 DIAGNOSIS — O280 Abnormal hematological finding on antenatal screening of mother: Secondary | ICD-10-CM

## 2014-06-24 ENCOUNTER — Encounter: Payer: Self-pay | Admitting: Obstetrics and Gynecology

## 2014-06-24 ENCOUNTER — Ambulatory Visit (INDEPENDENT_AMBULATORY_CARE_PROVIDER_SITE_OTHER): Payer: Self-pay | Admitting: Obstetrics and Gynecology

## 2014-06-24 VITALS — BP 103/66 | HR 92 | Temp 98.2°F | Wt 134.3 lb

## 2014-06-24 DIAGNOSIS — N390 Urinary tract infection, site not specified: Secondary | ICD-10-CM

## 2014-06-24 DIAGNOSIS — O2342 Unspecified infection of urinary tract in pregnancy, second trimester: Secondary | ICD-10-CM

## 2014-06-24 DIAGNOSIS — Z3482 Encounter for supervision of other normal pregnancy, second trimester: Secondary | ICD-10-CM

## 2014-06-24 DIAGNOSIS — Z3480 Encounter for supervision of other normal pregnancy, unspecified trimester: Secondary | ICD-10-CM

## 2014-06-24 DIAGNOSIS — O2390 Unspecified genitourinary tract infection in pregnancy, unspecified trimester: Secondary | ICD-10-CM

## 2014-06-24 LAB — POCT URINALYSIS DIP (DEVICE)
Bilirubin Urine: NEGATIVE
GLUCOSE, UA: NEGATIVE mg/dL
Hgb urine dipstick: NEGATIVE
KETONES UR: NEGATIVE mg/dL
LEUKOCYTES UA: NEGATIVE
Nitrite: NEGATIVE
PH: 6.5 (ref 5.0–8.0)
Protein, ur: NEGATIVE mg/dL
SPECIFIC GRAVITY, URINE: 1.02 (ref 1.005–1.030)
Urobilinogen, UA: 0.2 mg/dL (ref 0.0–1.0)

## 2014-06-24 LAB — CBC
HCT: 34 % — ABNORMAL LOW (ref 36.0–46.0)
Hemoglobin: 11.7 g/dL — ABNORMAL LOW (ref 12.0–15.0)
MCH: 31.1 pg (ref 26.0–34.0)
MCHC: 34.4 g/dL (ref 30.0–36.0)
MCV: 90.4 fL (ref 78.0–100.0)
Platelets: 220 10*3/uL (ref 150–400)
RBC: 3.76 MIL/uL — ABNORMAL LOW (ref 3.87–5.11)
RDW: 13.5 % (ref 11.5–15.5)
WBC: 9.9 10*3/uL (ref 4.0–10.5)

## 2014-06-24 NOTE — Progress Notes (Signed)
Reports feeling baby move daily but states it is very little, only a couple times per day; states it is very different from her past pregnancies and states it feels like the beginning of her other pregnancies.  Reports occasional cramping.

## 2014-06-24 NOTE — Patient Instructions (Signed)
Segundo trimestre de embarazo (Second Trimester of Pregnancy) El segundo trimestre va desde la semana13 hasta la 28, desde el cuarto hasta el sexto mes, y suele ser el momento en el que mejor se siente. Su organismo se ha adaptado a estar embarazada y comienza a sentirse fsicamente mejor. En general, las nuseas matutinas han disminuido o han desaparecido completamente, p El segundo trimestre es tambin la poca en la que el feto se desarrolla rpidamente. Hacia el final del sexto mes, el feto mide aproximadamente 9pulgadas (23cm) y pesa alrededor de 1 libras (700g). Es probable que sienta que el beb se mueve (da pataditas) entre las 18 y 20semanas del embarazo. CAMBIOS EN EL ORGANISMO Su organismo atraviesa por muchos cambios durante el embarazo, y estos varan de una mujer a otra.   Seguir aumentando de peso. Notar que la parte baja del abdomen sobresale.  Podrn aparecer las primeras estras en las caderas, el abdomen y las mamas.  Es posible que tenga dolores de cabeza que pueden aliviarse con los medicamentos que su mdico autorice.  Tal vez tenga necesidad de orinar con ms frecuencia porque el feto est ejerciendo presin sobre la vejiga.  Debido al embarazo podr sentir acidez estomacal con frecuencia.  Puede estar estreida, ya que ciertas hormonas enlentecen los movimientos de los msculos que empujan los desechos a travs de los intestinos.  Pueden aparecer hemorroides o abultarse e hincharse las venas (venas varicosas).  Puede tener dolor de espalda que se debe al aumento de peso y a que las hormonas del embarazo relajan las articulaciones entre los huesos de la pelvis, y como consecuencia de la modificacin del peso y los msculos que mantienen el equilibrio.  Las mamas seguirn creciendo y le dolern.  Las encas pueden sangrar y estar sensibles al cepillado y al hilo dental.  Pueden aparecer zonas oscuras o manchas (cloasma, mscara del embarazo) en el rostro que  probablemente se atenuarn despus del nacimiento del beb.  Es posible que se forme una lnea oscura desde el ombligo hasta la zona del pubis (linea nigra) que probablemente se atenuarn despus del nacimiento del beb.  Tal vez haya cambios en el cabello que pueden incluir su engrosamiento, crecimiento rpido y cambios en la textura. Adems, a algunas mujeres se les cae el cabello durante o despus del embarazo, o tienen el cabello seco o fino. Lo ms probable es que el cabello se le normalice despus del nacimiento del beb. QU DEBE ESPERAR EN LAS CONSULTAS PRENATALES Durante una visita prenatal de rutina:  La pesarn para asegurarse de que usted y el feto estn creciendo normalmente.  Le tomarn la presin arterial.  Le medirn el abdomen para controlar el desarrollo del beb.  Se escucharn los latidos cardacos fetales.  Se evaluarn los resultados de los estudios solicitados en visitas anteriores. El mdico puede preguntarle lo siguiente:  Cmo se siente.  Si siente los movimientos del beb.  Si ha tenido sntomas anormales, como prdida de lquido, sangrado, dolores de cabeza intensos o clicos abdominales.  Si tiene alguna pregunta. Otros estudios que podrn realizarse durante el segundo trimestre incluyen lo siguiente:  Anlisis de sangre para detectar:  Concentraciones de hierro bajas (anemia).  Diabetes gestacional (entre la semana 24 y la 28).  Anticuerpos Rh.  Anlisis de orina para detectar infecciones, diabetes o protenas en la orina.  Una ecografa para confirmar que el beb crece y se desarrolla correctamente.  Una amniocentesis para diagnosticar posibles problemas genticos.  Estudios del feto para descartar espina   bfida y sndrome de Down. INSTRUCCIONES PARA EL CUIDADO EN EL HOGAR   Evite fumar, consumir hierbas, beber alcohol y tomar frmacos que no le hayan recetado. Estas sustancias qumicas afectan la formacin y el desarrollo del beb.  Siga  las indicaciones del mdico en relacin con el uso de medicamentos. Durante el embarazo, hay medicamentos que son seguros de tomar y otros que no.  Haga actividad fsica solo en la forma indicada por el mdico. Sentir clicos uterinos es un buen signo para detener la actividad fsica.  Contine comiendo alimentos que sanos con regularidad.  Use un sostn que le brinde buen soporte si le duelen las mamas.  No se d baos de inmersin en agua caliente, baos turcos ni saunas.  Colquese el cinturn de seguridad cuando conduzca.  No coma carne cruda ni queso sin cocinar; evite el contacto con las bandejas sanitarias de los gatos y la tierra que estos animales usan. Estos elementos contienen grmenes que pueden causar defectos congnitos en el beb.  Tome las vitaminas prenatales.  Si est estreida, pruebe un laxante suave (si el mdico lo autoriza). Consuma ms alimentos ricos en fibra, como vegetales y frutas frescos y cereales integrales. Beba gran cantidad de lquido para mantener la orina de tono claro o color amarillo plido.  Dese baos de asiento con agua tibia para aliviar el dolor o las molestias causadas por las hemorroides. Use una crema para las hemorroides si el mdico la autoriza.  Si tiene venas varicosas, use medias de descanso. Eleve los pies durante 15minutos, 3 o 4veces por da. Limite la cantidad de sal en su dieta.  No levante objetos pesados, use zapatos de tacones bajos y mantenga una buena postura.  Descanse con las piernas elevadas si tiene calambres o dolor de cintura.  Visite a su dentista si an no lo ha hecho durante el embarazo. Use un cepillo de dientes blando para higienizarse los dientes y psese el hilo dental con suavidad.  Puede seguir manteniendo relaciones sexuales, a menos que el mdico le indique lo contrario.  Concurra a todas las visitas prenatales segn las indicaciones de su mdico. SOLICITE ATENCIN MDICA SI:   Tiene mareos.  Siente  clicos leves, presin en la pelvis o dolor persistente en el abdomen.  Tiene nuseas, vmitos o diarrea persistentes.  Tiene secrecin vaginal con mal olor.  Siente dolor al orinar. SOLICITE ATENCIN MDICA DE INMEDIATO SI:   Tiene fiebre.  Tiene una prdida de lquido por la vagina.  Tiene sangrado o pequeas prdidas vaginales.  Siente dolor intenso o clicos en el abdomen.  Sube o baja de peso rpidamente.  Tiene dificultad para respirar y siente dolor de pecho.  Sbitamente se le hinchan mucho el rostro, las manos, los tobillos, los pies o las piernas.  No ha sentido los movimientos del beb durante una hora.  Siente un dolor de cabeza intenso que no se alivia con medicamentos.  Hay cambios en la visin. Document Released: 09/14/2005 Document Revised: 12/10/2013 Exit[employer] Patient Information 2015 [employer]. This information is not intended to replace advice given to you by your health care provider. Make sure you discuss any questions you have with your health care provider.  

## 2014-06-24 NOTE — Progress Notes (Signed)
Discussed at length (through interpreter) importance of frequency of FM. Her baby moves at least 4x/hr; she just thought the movements should be more intense. Frequent FM today. DT 140s-150s with lots of audible FM.  1 hr glucola and routine labs today.

## 2014-06-25 ENCOUNTER — Ambulatory Visit (HOSPITAL_COMMUNITY)
Admission: RE | Admit: 2014-06-25 | Discharge: 2014-06-25 | Disposition: A | Discharge status: Home / Self Care | Payer: Self-pay | Source: Ambulatory Visit | Attending: Advanced Practice Midwife | Admitting: Advanced Practice Midwife

## 2014-06-25 VITALS — BP 105/55 | HR 93 | Wt 136.5 lb

## 2014-06-25 DIAGNOSIS — O289 Unspecified abnormal findings on antenatal screening of mother: Secondary | ICD-10-CM | POA: Insufficient documentation

## 2014-06-25 DIAGNOSIS — O280 Abnormal hematological finding on antenatal screening of mother: Secondary | ICD-10-CM

## 2014-06-25 LAB — HIV ANTIBODY (ROUTINE TESTING W REFLEX): HIV: NONREACTIVE

## 2014-06-25 LAB — RPR

## 2014-06-25 LAB — GLUCOSE TOLERANCE, 1 HOUR (50G) W/O FASTING: Glucose, 1 Hour GTT: 123 mg/dL (ref 70–140)

## 2014-07-09 ENCOUNTER — Encounter (HOSPITAL_COMMUNITY): Payer: Self-pay

## 2014-07-23 ENCOUNTER — Ambulatory Visit (INDEPENDENT_AMBULATORY_CARE_PROVIDER_SITE_OTHER): Payer: Self-pay | Admitting: Family

## 2014-07-23 VITALS — BP 113/52 | HR 96 | Temp 98.3°F | Wt 139.8 lb

## 2014-07-23 DIAGNOSIS — Z3483 Encounter for supervision of other normal pregnancy, third trimester: Secondary | ICD-10-CM

## 2014-07-23 DIAGNOSIS — Z3480 Encounter for supervision of other normal pregnancy, unspecified trimester: Secondary | ICD-10-CM

## 2014-07-23 LAB — POCT URINALYSIS DIP (DEVICE)
Bilirubin Urine: NEGATIVE
Glucose, UA: NEGATIVE mg/dL
HGB URINE DIPSTICK: NEGATIVE
Ketones, ur: NEGATIVE mg/dL
Leukocytes, UA: NEGATIVE
Nitrite: NEGATIVE
PROTEIN: NEGATIVE mg/dL
Specific Gravity, Urine: 1.015 (ref 1.005–1.030)
UROBILINOGEN UA: 0.2 mg/dL (ref 0.0–1.0)
pH: 6.5 (ref 5.0–8.0)

## 2014-07-23 MED ORDER — FAMOTIDINE 40 MG PO TABS: 40.0000 mg | ORAL_TABLET | Freq: Every day | ORAL | Status: DC

## 2014-07-23 NOTE — Progress Notes (Signed)
Reports diarrhea x 5 days, ceased three days ago.  +cramping, denies vaginal bleeding or LOF.  Cervix - closed.  Pt disclosed that [facility].  [facility]  Discussed needing to find a provider where she plans to deliver and getting records transferred to hospital.

## 2014-07-23 NOTE — Progress Notes (Signed)
C/o of "lots of cramping" -- 5-6 times per day.  C/o of episode of diarrhea that lasted 5 days -- started last Friday and ended this Monday.  Patient considering Tdap-- information sheet given. Patient to decide by next visit.

## 2014-07-29 ENCOUNTER — Telehealth: Payer: Self-pay

## 2014-07-29 NOTE — Telephone Encounter (Signed)
Called patient with interpreter [name] HesselbachMaria. Asked patient where she was going to continue prenatal care as [facility]. Patient got very defensive, raising her voice, stating she does not have another provider, she does not know anything but she knows she probably will not deliver here.  Explained to patient that it would be in her best interest to be [facility] a [facility] so that they will then be familiar with her and can follow her until delivery. Patient states no one will take her because she is so far along. Patient also  explained that the person who brings [place] to her appointments [place] in [place] and that is why she comes here but explains that [place] near [place] and when she delivers the person may not be available [place]  Patient wishes to still come to prenatal visits here. Patient stated she will be at appointment 08/11/14. Advised patient that as she gets closer to delivery she should obtain her records in hand from our office so that she can bring them with her if she is to deliver at [place]. Patient verbalized understanding. No questions or concerns.

## 2014-07-30 ENCOUNTER — Ambulatory Visit (HOSPITAL_COMMUNITY)
Admission: RE | Admit: 2014-07-30 | Discharge: 2014-07-30 | Disposition: A | Discharge status: Home / Self Care | Payer: Self-pay | Source: Ambulatory Visit | Attending: Advanced Practice Midwife | Admitting: Advanced Practice Midwife

## 2014-07-30 ENCOUNTER — Encounter (HOSPITAL_COMMUNITY): Payer: Self-pay

## 2014-07-30 DIAGNOSIS — Z3689 Encounter for other specified antenatal screening: Secondary | ICD-10-CM | POA: Insufficient documentation

## 2014-07-30 DIAGNOSIS — O289 Unspecified abnormal findings on antenatal screening of mother: Secondary | ICD-10-CM | POA: Insufficient documentation

## 2014-07-30 DIAGNOSIS — O280 Abnormal hematological finding on antenatal screening of mother: Secondary | ICD-10-CM

## 2014-08-11 ENCOUNTER — Ambulatory Visit (INDEPENDENT_AMBULATORY_CARE_PROVIDER_SITE_OTHER): Payer: Self-pay | Admitting: Physician Assistant

## 2014-08-11 VITALS — BP 111/64 | HR 92 | Wt 144.0 lb

## 2014-08-11 DIAGNOSIS — Z3480 Encounter for supervision of other normal pregnancy, unspecified trimester: Secondary | ICD-10-CM

## 2014-08-11 DIAGNOSIS — Z23 Encounter for immunization: Secondary | ICD-10-CM

## 2014-08-11 DIAGNOSIS — Z3483 Encounter for supervision of other normal pregnancy, third trimester: Secondary | ICD-10-CM

## 2014-08-11 LAB — POCT URINALYSIS DIP (DEVICE)
Bilirubin Urine: NEGATIVE
Glucose, UA: NEGATIVE mg/dL
Ketones, ur: NEGATIVE mg/dL
NITRITE: NEGATIVE
PH: 7 (ref 5.0–8.0)
Protein, ur: NEGATIVE mg/dL
Specific Gravity, Urine: 1.01 (ref 1.005–1.030)
UROBILINOGEN UA: 1 mg/dL (ref 0.0–1.0)

## 2014-08-11 MED ORDER — TETANUS-DIPHTH-ACELL PERTUSSIS 5-2.5-18.5 LF-MCG/0.5 IM SUSP
0.5000 mL | Freq: Once | INTRAMUSCULAR | Status: AC
  Administered 2014-08-11: 0.5 mL via INTRAMUSCULAR

## 2014-08-11 NOTE — Patient Instructions (Signed)
Tercer trimestre de embarazo (Third Trimester of Pregnancy) El tercer trimestre va desde la semana29 hasta la 42, desde el sptimo hasta el noveno mes, y es la poca en la que el feto crece ms rpidamente. Hacia el final del noveno mes, el feto mide alrededor de 20pulgadas (45cm) de largo y pesa entre 6 y 10 libras (2,700 y 4,500kg).  CAMBIOS EN EL ORGANISMO Su organismo atraviesa por muchos cambios durante el embarazo, y estos varan de una mujer a otra.   Seguir aumentando de peso. Es de esperar que aumente entre 25 y 35libras (11 y 16kg) hacia el final del embarazo.  Podrn aparecer las primeras estras en las caderas, el abdomen y las mamas.  Puede tener necesidad de orinar con ms frecuencia porque el feto baja hacia la pelvis y ejerce presin sobre la vejiga.  Debido al embarazo podr sentir acidez estomacal con frecuencia.  Puede estar estreida, ya que ciertas hormonas enlentecen los movimientos de los msculos que empujan los desechos a travs de los intestinos.  Pueden aparecer hemorroides o abultarse e hincharse las venas (venas varicosas).  Puede sentir dolor plvico debido al aumento de peso y a que las hormonas del embarazo relajan las articulaciones entre los huesos de la pelvis. El dolor de espalda puede ser consecuencia de la sobrecarga de los msculos que soportan la postura.  Tal vez haya cambios en el cabello que pueden incluir su engrosamiento, crecimiento rpido y cambios en la textura. Adems, a algunas mujeres se les cae el cabello durante o despus del embarazo, o tienen el cabello seco o fino. Lo ms probable es que el cabello se le normalice despus del nacimiento del beb.  Las mamas seguirn creciendo y le dolern. A veces, puede haber una secrecin amarilla de las mamas llamada calostro.  El ombligo puede salir hacia afuera.  Puede sentir que le falta el aire debido a que se expande el tero.  Puede notar que el feto "baja" o lo siente ms bajo, en el  abdomen.  Puede tener una prdida de secrecin mucosa con sangre. Esto suele ocurrir en el trmino de unos pocos das a una semana antes de que comience el trabajo de parto.  El cuello del tero se vuelve delgado y blando (se borra) cerca de la fecha de parto. QU DEBE ESPERAR EN LOS EXMENES PRENATALES  Le harn exmenes prenatales cada 2semanas hasta la semana36. A partir de ese momento le harn exmenes semanales. Durante una visita prenatal de rutina:  La pesarn para asegurarse de que usted y el feto estn creciendo normalmente.  Le tomarn la presin arterial.  Le medirn el abdomen para controlar el desarrollo del beb.  Se escucharn los latidos cardacos fetales.  Se evaluarn los resultados de los estudios solicitados en visitas anteriores.  Le revisarn el cuello del tero cuando est prxima la fecha de parto para controlar si este se ha borrado. Alrededor de la semana36, el mdico le revisar el cuello del tero. Al mismo tiempo, realizar un anlisis de las secreciones del tejido vaginal. Este examen es para determinar si hay un tipo de bacteria, estreptococo Grupo B. El mdico le explicar esto con ms detalle. El mdico puede preguntarle lo siguiente:  Cmo le gustara que fuera el parto.  Cmo se siente.  Si siente los movimientos del beb.  Si ha tenido sntomas anormales, como prdida de lquido, sangrado, dolores de cabeza intensos o clicos abdominales.  Si tiene alguna pregunta. Otros exmenes o estudios de deteccin que pueden realizarse   durante el tercer trimestre incluyen lo siguiente:  Anlisis de sangre para controlar las concentraciones de hierro (anemia).  Controles fetales para determinar su salud, nivel de actividad y crecimiento. Si tiene alguna enfermedad o hay problemas durante el embarazo, le harn estudios. FALSO TRABAJO DE PARTO Es posible que sienta contracciones leves e irregulares que finalmente desaparecen. Se llaman contracciones de  Braxton Hicks o falso trabajo de parto. Las contracciones pueden durar horas, das o incluso semanas, antes de que el verdadero trabajo de parto se inicie. Si las contracciones ocurren a intervalos regulares, se intensifican o se hacen dolorosas, lo mejor es que la revise el mdico.  SIGNOS DE TRABAJO DE PARTO   Clicos de tipo menstrual.  Contracciones cada 5minutos o menos.  Contracciones que comienzan en la parte superior del tero y se extienden hacia abajo, a la zona inferior del abdomen y la espalda.  Sensacin de mayor presin en la pelvis o dolor de espalda.  Una secrecin de mucosidad acuosa o con sangre que sale de la vagina. Si tiene alguno de estos signos antes de la semana37 del embarazo, llame a su mdico de inmediato. Debe concurrir al hospital para que la controlen inmediatamente. INSTRUCCIONES PARA EL CUIDADO EN EL HOGAR   Evite fumar, consumir hierbas, beber alcohol y tomar frmacos que no le hayan recetado. Estas sustancias qumicas afectan la formacin y el desarrollo del beb.  Siga las indicaciones del mdico en relacin con el uso de medicamentos. Durante el embarazo, hay medicamentos que son seguros de tomar y otros que no.  Haga actividad fsica solo en la forma indicada por el mdico. Sentir clicos uterinos es un buen signo para detener la actividad fsica.  Contine comiendo alimentos que sanos con regularidad.  Use un sostn que le brinde buen soporte si le duelen las mamas.  No se d baos de inmersin en agua caliente, baos turcos ni saunas.  Colquese el cinturn de seguridad cuando conduzca.  No coma carne cruda ni queso sin cocinar; evite el contacto con las bandejas sanitarias de los gatos y la tierra que estos animales usan. Estos elementos contienen grmenes que pueden causar defectos congnitos en el beb.  Tome las vitaminas prenatales.  Si est estreida, pruebe un laxante suave (si el mdico lo autoriza). Consuma ms alimentos ricos en  fibra, como vegetales y frutas frescos y cereales integrales. Beba gran cantidad de lquido para mantener la orina de tono claro o color amarillo plido.  Dese baos de asiento con agua tibia para aliviar el dolor o las molestias causadas por las hemorroides. Use una crema para las hemorroides si el mdico la autoriza.  Si tiene venas varicosas, use medias de descanso. Eleve los pies durante 15minutos, 3 o 4veces por da. Limite la cantidad de sal en su dieta.  Evite levantar objetos pesados, use zapatos de tacones bajos y mantenga una buena postura.  Descanse con las piernas elevadas si tiene calambres o dolor de cintura.  Visite a su dentista si no lo ha hecho durante el embarazo. Use un cepillo de dientes blando para higienizarse los dientes y psese el hilo dental con suavidad.  Puede seguir manteniendo relaciones sexuales, a menos que el mdico le indique lo contrario.  No haga viajes largos excepto que sea absolutamente necesario y solo con la autorizacin del mdico.  Tome clases prenatales para entender, practicar y hacer preguntas sobre el trabajo de parto y el parto.  Haga un ensayo de la partida al hospital.  Prepare el bolso que   llevar al hospital.  Prepare la habitacin del beb.  Concurra a todas las visitas prenatales segn las indicaciones de su mdico. SOLICITE ATENCIN MDICA SI:  No est segura de que est en trabajo de parto o de que ha roto la bolsa de las aguas.  Tiene mareos.  Siente clicos leves, presin en la pelvis o dolor persistente en el abdomen.  Tiene nuseas, vmitos o diarrea persistentes.  Tiene secrecin vaginal con mal olor.  Siente dolor al orinar. SOLICITE ATENCIN MDICA DE INMEDIATO SI:   Tiene fiebre.  Tiene una prdida de lquido por la vagina.  Tiene sangrado o pequeas prdidas vaginales.  Siente dolor intenso o clicos en el abdomen.  Sube o baja de peso rpidamente.  Tiene dificultad para respirar y siente dolor de  pecho.  Sbitamente se le hinchan mucho el rostro, las manos, los tobillos, los pies o las piernas.  No ha sentido los movimientos del beb durante una hora.  Siente un dolor de cabeza intenso que no se alivia con medicamentos.  Hay cambios en la visin. Document Released: 09/14/2005 Document Revised: 12/10/2013 Exit[employer] Patient Information 2015 [employer]. This information is not intended to replace advice given to you by your health care provider. Make sure you discuss any questions you have with your health care provider.  

## 2014-08-11 NOTE — Progress Notes (Signed)
34w6days, stable, no complaints Continue PNV Tdap today. RTC 2 weeks Pt is planning to deliver at [facility] because it is closer.  She is informed of the importance of obtaining PNC at the location where she plans to deliver.  She will consider.

## 2014-08-13 LAB — URINE CULTURE: Colony Count: 40000

## 2014-08-27 ENCOUNTER — Ambulatory Visit (INDEPENDENT_AMBULATORY_CARE_PROVIDER_SITE_OTHER): Payer: Self-pay | Admitting: Advanced Practice Midwife

## 2014-08-27 ENCOUNTER — Encounter: Payer: Self-pay | Admitting: Advanced Practice Midwife

## 2014-08-27 ENCOUNTER — Other Ambulatory Visit: Payer: Self-pay | Admitting: Advanced Practice Midwife

## 2014-08-27 VITALS — BP 116/68 | HR 97 | Wt 145.2 lb

## 2014-08-27 DIAGNOSIS — Z3480 Encounter for supervision of other normal pregnancy, unspecified trimester: Secondary | ICD-10-CM

## 2014-08-27 DIAGNOSIS — Z23 Encounter for immunization: Secondary | ICD-10-CM

## 2014-08-27 DIAGNOSIS — Z3483 Encounter for supervision of other normal pregnancy, third trimester: Secondary | ICD-10-CM

## 2014-08-27 LAB — OB RESULTS CONSOLE GC/CHLAMYDIA
Chlamydia: NEGATIVE
Gonorrhea: NEGATIVE

## 2014-08-27 LAB — POCT URINALYSIS DIP (DEVICE)
GLUCOSE, UA: NEGATIVE mg/dL
NITRITE: NEGATIVE
Protein, ur: NEGATIVE mg/dL
Specific Gravity, Urine: 1.02 (ref 1.005–1.030)
Urobilinogen, UA: 1 mg/dL (ref 0.0–1.0)
pH: 7 (ref 5.0–8.0)

## 2014-08-27 LAB — OB RESULTS CONSOLE GBS: STREP GROUP B AG: NEGATIVE

## 2014-08-27 NOTE — Patient Instructions (Signed)
Tercer trimestre de embarazo (Third Trimester of Pregnancy) El tercer trimestre va desde la semana29 hasta la 42, desde el sptimo hasta el noveno mes, y es la poca en la que el feto crece ms rpidamente. Hacia el final del noveno mes, el feto mide alrededor de 20pulgadas (45cm) de largo y pesa entre 6 y 10 libras (2,700 y 4,500kg).  CAMBIOS EN EL ORGANISMO Su organismo atraviesa por muchos cambios durante el embarazo, y estos varan de una mujer a otra.   Seguir aumentando de peso. Es de esperar que aumente entre 25 y 35libras (11 y 16kg) hacia el final del embarazo.  Podrn aparecer las primeras estras en las caderas, el abdomen y las mamas.  Puede tener necesidad de orinar con ms frecuencia porque el feto baja hacia la pelvis y ejerce presin sobre la vejiga.  Debido al embarazo podr sentir acidez estomacal con frecuencia.  Puede estar estreida, ya que ciertas hormonas enlentecen los movimientos de los msculos que empujan los desechos a travs de los intestinos.  Pueden aparecer hemorroides o abultarse e hincharse las venas (venas varicosas).  Puede sentir dolor plvico debido al aumento de peso y a que las hormonas del embarazo relajan las articulaciones entre los huesos de la pelvis. El dolor de espalda puede ser consecuencia de la sobrecarga de los msculos que soportan la postura.  Tal vez haya cambios en el cabello que pueden incluir su engrosamiento, crecimiento rpido y cambios en la textura. Adems, a algunas mujeres se les cae el cabello durante o despus del embarazo, o tienen el cabello seco o fino. Lo ms probable es que el cabello se le normalice despus del nacimiento del beb.  Las mamas seguirn creciendo y le dolern. A veces, puede haber una secrecin amarilla de las mamas llamada calostro.  El ombligo puede salir hacia afuera.  Puede sentir que le falta el aire debido a que se expande el tero.  Puede notar que el feto "baja" o lo siente ms bajo, en el  abdomen.  Puede tener una prdida de secrecin mucosa con sangre. Esto suele ocurrir en el trmino de unos pocos das a una semana antes de que comience el trabajo de parto.  El cuello del tero se vuelve delgado y blando (se borra) cerca de la fecha de parto. QU DEBE ESPERAR EN LOS EXMENES PRENATALES  Le harn exmenes prenatales cada 2semanas hasta la semana36. A partir de ese momento le harn exmenes semanales. Durante una visita prenatal de rutina:  La pesarn para asegurarse de que usted y el feto estn creciendo normalmente.  Le tomarn la presin arterial.  Le medirn el abdomen para controlar el desarrollo del beb.  Se escucharn los latidos cardacos fetales.  Se evaluarn los resultados de los estudios solicitados en visitas anteriores.  Le revisarn el cuello del tero cuando est prxima la fecha de parto para controlar si este se ha borrado. Alrededor de la semana36, el mdico le revisar el cuello del tero. Al mismo tiempo, realizar un anlisis de las secreciones del tejido vaginal. Este examen es para determinar si hay un tipo de bacteria, estreptococo Grupo B. El mdico le explicar esto con ms detalle. El mdico puede preguntarle lo siguiente:  Cmo le gustara que fuera el parto.  Cmo se siente.  Si siente los movimientos del beb.  Si ha tenido sntomas anormales, como prdida de lquido, sangrado, dolores de cabeza intensos o clicos abdominales.  Si tiene alguna pregunta. Otros exmenes o estudios de deteccin que pueden realizarse   durante el tercer trimestre incluyen lo siguiente:  Anlisis de sangre para controlar las concentraciones de hierro (anemia).  Controles fetales para determinar su salud, nivel de actividad y crecimiento. Si tiene alguna enfermedad o hay problemas durante el embarazo, le harn estudios. FALSO TRABAJO DE PARTO Es posible que sienta contracciones leves e irregulares que finalmente desaparecen. Se llaman contracciones de  Braxton Hicks o falso trabajo de parto. Las contracciones pueden durar horas, das o incluso semanas, antes de que el verdadero trabajo de parto se inicie. Si las contracciones ocurren a intervalos regulares, se intensifican o se hacen dolorosas, lo mejor es que la revise el mdico.  SIGNOS DE TRABAJO DE PARTO   Clicos de tipo menstrual.  Contracciones cada 5minutos o menos.  Contracciones que comienzan en la parte superior del tero y se extienden hacia abajo, a la zona inferior del abdomen y la espalda.  Sensacin de mayor presin en la pelvis o dolor de espalda.  Una secrecin de mucosidad acuosa o con sangre que sale de la vagina. Si tiene alguno de estos signos antes de la semana37 del embarazo, llame a su mdico de inmediato. Debe concurrir al hospital para que la controlen inmediatamente. INSTRUCCIONES PARA EL CUIDADO EN EL HOGAR   Evite fumar, consumir hierbas, beber alcohol y tomar frmacos que no le hayan recetado. Estas sustancias qumicas afectan la formacin y el desarrollo del beb.  Siga las indicaciones del mdico en relacin con el uso de medicamentos. Durante el embarazo, hay medicamentos que son seguros de tomar y otros que no.  Haga actividad fsica solo en la forma indicada por el mdico. Sentir clicos uterinos es un buen signo para detener la actividad fsica.  Contine comiendo alimentos que sanos con regularidad.  Use un sostn que le brinde buen soporte si le duelen las mamas.  No se d baos de inmersin en agua caliente, baos turcos ni saunas.  Colquese el cinturn de seguridad cuando conduzca.  No coma carne cruda ni queso sin cocinar; evite el contacto con las bandejas sanitarias de los gatos y la tierra que estos animales usan. Estos elementos contienen grmenes que pueden causar defectos congnitos en el beb.  Tome las vitaminas prenatales.  Si est estreida, pruebe un laxante suave (si el mdico lo autoriza). Consuma ms alimentos ricos en  fibra, como vegetales y frutas frescos y cereales integrales. Beba gran cantidad de lquido para mantener la orina de tono claro o color amarillo plido.  Dese baos de asiento con agua tibia para aliviar el dolor o las molestias causadas por las hemorroides. Use una crema para las hemorroides si el mdico la autoriza.  Si tiene venas varicosas, use medias de descanso. Eleve los pies durante 15minutos, 3 o 4veces por da. Limite la cantidad de sal en su dieta.  Evite levantar objetos pesados, use zapatos de tacones bajos y mantenga una buena postura.  Descanse con las piernas elevadas si tiene calambres o dolor de cintura.  Visite a su dentista si no lo ha hecho durante el embarazo. Use un cepillo de dientes blando para higienizarse los dientes y psese el hilo dental con suavidad.  Puede seguir manteniendo relaciones sexuales, a menos que el mdico le indique lo contrario.  No haga viajes largos excepto que sea absolutamente necesario y solo con la autorizacin del mdico.  Tome clases prenatales para entender, practicar y hacer preguntas sobre el trabajo de parto y el parto.  Haga un ensayo de la partida al hospital.  Prepare el bolso que   llevar al hospital.  Prepare la habitacin del beb.  Concurra a todas las visitas prenatales segn las indicaciones de su mdico. SOLICITE ATENCIN MDICA SI:  No est segura de que est en trabajo de parto o de que ha roto la bolsa de las aguas.  Tiene mareos.  Siente clicos leves, presin en la pelvis o dolor persistente en el abdomen.  Tiene nuseas, vmitos o diarrea persistentes.  Tiene secrecin vaginal con mal olor.  Siente dolor al [employer]. SOLICITE ATENCIN MDICA DE INMEDIATO SI:   Tiene fiebre.  Tiene una prdida de lquido por la vagina.  Tiene sangrado o pequeas prdidas vaginales.  Siente dolor intenso o clicos en el abdomen.  Sube o baja de peso rpidamente.  Tiene dificultad para respirar y siente dolor de  pecho.  Sbitamente se le hinchan mucho el rostro, las [place], los tobillos, los pies o las piernas.  No ha sentido los movimientos del beb durante Georgianne Fick.  Siente un dolor de cabeza intenso que no se alivia con medicamentos.  Hay cambios en la visin. Document Released: 09/14/2005 Document Revised: 12/10/2013 [facility] Patient Information 2015 [place], [place]. This information is not intended to replace advice given to you by your health care provider. Make sure you discuss any questions you have with your health care provider.   Vacuna antigripal (vacuna antigripal, inactivada o recombinante) 2014-2015: lo que debe saber (Influenza Vaccine (Flu Vaccine, Inactivated or Recombinant) 2014-2015: What You Need to Know) 1. Por qu vacunarse? La influenza ("gripe") es una enfermedad contagiosa que se propaga por los [place] en invierno, por lo general entre octubre y [place]. La gripe es causada por los virus de la influenza y se propaga principalmente por la tos, el estornudo y Customer service manager. Cualquier persona puede Writer gripe, Biomedical engineer el riesgo es mayor entre los nios. Los sntomas aparecen rpidamente y pueden durar [address]. Estos pueden ser:  Grant Ruts o escalofros.  Dolor de Advertising copywriter.  Dolores musculares.  Fatiga.  Tos.  Dolor de [place].  Secrecin o congestin nasal. La gripe puede hacer que algunas personas se enfermen ms que otras. [employer] se incluyen a los nios pequeos, las [employer] de 65 aos, las mujeres embarazadas y las personas con Runner, broadcasting/film/video, como enfermedades cardacas, pulmonares o renales, trastornos del [place], o que tienen un sistema inmunitario debilitado. La vacuna contra la gripe es especialmente importante para estas personas y para todos los que estn en contacto directo con ellas. La gripe tambin puede causar neumona y Theme park manager las afecciones existentes. En los nios, puede provocar diarrea y  convulsiones. Cada ao, miles de [employer] [place] debido a la gripe, y muchas ms deben ser hospitalizadas. La vacuna contra la gripe es la mejor proteccin contra la gripe y sus complicaciones. Tambin ayuda a prevenir el contagio de la gripe de Neomia Dear persona a Educational psychologist. 2. Vacunas antigripales inactivadas y recombinantes Recibe una vacuna inyectable contra la gripe que es una vacuna "inactivada" o "recombinante". Estas vacunas no contienen ningn virus de la gripe vivo. Se administran en forma de inyeccin con [place] y se las conoce como "vacuna antigripal".  Otro tipo de vacuna antigripal de virus vivos, atenuados (debilitados), se aplica en forma de aerosol en las fosas nasales. Esta vacuna se describe en el apartado Declaracin de informacin sobre las vacunas. Se recomienda la aplicacin de la vacuna contra la gripe todos los [place]. Algunos nios de seis meses a ocho aos de Loss adjuster, chartered  dos dosis durante un ao. Los virus de la gripe [place] constantemente. Cada ao, la vacuna contra la gripe se actualiza para proteger contra los tres o cuatro virus que tienen ms probabilidades de causar la enfermedad ese ao. Aunque la vacuna no puede prevenir todos los casos de gripe, es la mejor defensa contra la enfermedad.  La vacuna comienza a tener [employer] despus de la vacunacin, y la proteccin dura de unos meses a un ao. Muchas veces se confunden con la gripe algunas enfermedades que no son causadas por el virus de la gripe. La vacuna contra la gripe no previene estas enfermedades. Solo puede prevenir la gripe. Algunas de las vacunas contra la gripe inactivada contienen una cantidad muy pequea de un conservante a base de mercurio llamado timerosal. Algunos estudios han demostrado que el timerosal en las vacunas no es perjudicial, pero se dispone de vacunas contra la gripe que no contienen el conservante. 3. Algunas personas no deben recibir esta  vacuna Informe a la persona que le aplica la vacuna:  Si tiene [place] graves, potencialmente mortales. Si alguna vez tuvo una reaccin alrgica potencialmente mortal despus de Neomia Dear dosis de la vacuna contra la gripe, o tuvo una alergia grave a cualquiera de los componentes de [place], incluida, por ejemplo, una alergia a la gelatina, a los antibiticos o a los [place], es posible que se le recomiende no recibir una dosis. La [employer]son de los tipos de vacuna contra la gripe, pero no todos, contienen una pequea cantidad de protena de [place].  Si alguna vez ha sufrido el sndrome de Pension scheme manager (una enfermedad paralizante grave tambin llamada GBS). Algunas personas con antecedentes de GBS no deben recibir esta vacuna. Debe hablarlo con su mdico.  Si no se siente bien. Por lo general, la vacuna contra la gripe no causa ningn problema si tiene una enfermedad leve, pero le podran aconsejar que espere hasta sentirse mejor. Debe volver cuando se sienta mejor. 4. Riesgos de [place] reaccin a la vacuna Con la vacuna, como con cualquier Automatic Data, existe la posibilidad de sufrir efectos secundarios. Suelen ser leves y desaparecen por s solos. Problemas que podran ocurrir despus de cualquier vacuna:  Episodios leves de desmayo pueden presentarse despus de cualquier procedimiento mdico, incluso despus de recibir [employer]. Si permanece sentado o recostado durante 15 minutos puede ayudar a [employer] Corporation y las lesiones causadas por las cadas. Informe al mdico si se siente mareado, tiene cambios en la visin o zumbidos en los odos.  Con muy poca frecuencia, despus de Cathleen Corti, puede presentarse dolor intenso en el hombro y [place] reduccin en el rango de movimiento del brazo donde se la aplic.  Las Therapist, art graves a Cathleen Corti son poco frecuentes; se estima una proporcin de menos de una en un milln de dosis. Si aparecen, generalmente es despus de algunos minutos o algunas  horas despus de la aplicacin de la vacuna. Problemas leves despus de recibir la vacuna de la gripe inactivada:  Dolor, enrojecimiento o Paramedic en el que le aplicaron la vacuna.  Ronquera.  Dolor, enrojecimiento o [employer] ojos.  Tos.  Grant Ruts.  Dolores.  Dolor de [place].  Picazn.  Fatiga. Si estos problemas ocurren, en general comienzan poco despus de vacunarse y duran [address]. Problemas moderados despus de recibir la vacuna contra la gripe inactivada:  Los nios que reciben la vacuna contra la gripe inactivada y la vacuna antineumoccica (PCV13) al [employer],  pueden tener un mayor riesgo de sufrir convulsiones causadas por fiebre. Consulte a su mdico para obtener ms informacin. Informe a su mdico si un nio que est recibiendo la vacuna contra la gripe ha tenido una convulsin. La vacuna contra la gripe inactivada no contiene el virus vivo; por lo tanto, no puede enfermarse por recibir [employer]. Al igual que con cualquier Automatic Data, existe una probabilidad remota de que una vacuna cause una lesin grave o la [place]. Se controla permanentemente la seguridad de las vacunas. Para obtener ms informacin, visite: http://floyd.org/ 5. Qu pasa si hay una reaccin grave? A qu signos debo estar atento?  Observe todo lo que le preocupe, como signos de una reaccin alrgica grave, fiebre muy alta o cambios en el comportamiento. Los signos de Runner, broadcasting/film/video grave pueden incluir ronchas, hinchazn de la cara y la garganta, dificultad para respirar, latidos cardacos acelerados, mareos y debilidad. Pueden comenzar entre unos pocos minutos y algunas horas despus de la vacunacin. Qu debo hacer?  Si cree que se trata de Runner, broadcasting/film/video grave o de otra emergencia que no puede esperar, llame al 911 o lleve a la persona al hospital ms cercano. Sino, llame a su mdico.  Despus, la reaccin debe informarse al [id number] de  Informacin sobre Efectos Adversos de las [place] (Vaccine Adverse Event Reporting System, VAERS). El mdico debe presentar este informe, o bien puede hacerlo usted mismo a travs del sitio web de VAERS, en www.vaers.LAgents.no, o llamando al ([phone]. VAERSno brinda recomendaciones mdicas. 6. Sun[employer] de Compensacin de Daos por [employer] [place] de Compensacin de Daos por Administrator, arts (National Vaccine Injury Compensation Program, VICP) es un programa federal que fue creado para Patent examiner a las personas que puedan haber sufrido daos al recibir ciertas vacunas. Aquellas personas que consideren que han sufrido un dao como consecuencia de una vacuna y quieren saber ms acerca del programa y cmo presentar Roslynn Amble, pueden llamar al [phone] o visitar su sitio web en [email or web address]. Hay un lmite de tiempo para presentar un reclamo de compensacin. 7. Cmo puedo obtener ms informacin?  Pregntele a su mdico  Comunquese con el servicio de salud de su localidad o [address].  Comunquese con los Centros para Air traffic controller y la Prevencin de Child psychotherapist for Disease Control and Prevention , CDC).  Llame al [phone] (1-800-CDC-INFO), o  visite la pgina web de CDC en BiotechRoom.com.cy. Declaracin de informacin sobre la vacuna (provisional) de los CDC Vacuna antigripal inactivada (08/06/2013) Document Released: 03/03/2009 Document Revised: 04/21/2014 [employer] Patient Information 2015 [place], [place]. This information is not intended to replace advice given to you by your health care provider. Make sure you discuss any questions you have with your health care provider.

## 2014-08-27 NOTE — Progress Notes (Signed)
Patient feeling well, no complaints. +FM, no vaginal bleeding/fluid leakage, little contractions (~ 2x/day). Getting flu vaccine today.  GBS and Gc/Chlam collected today.

## 2014-08-28 LAB — GC/CHLAMYDIA PROBE AMP
CT Probe RNA: NEGATIVE
GC PROBE AMP APTIMA: NEGATIVE

## 2014-08-30 ENCOUNTER — Encounter: Payer: Self-pay | Admitting: Advanced Practice Midwife

## 2014-08-30 LAB — CULTURE, BETA STREP (GROUP B ONLY)

## 2014-08-31 ENCOUNTER — Observation Stay: Payer: Self-pay | Admitting: Obstetrics and Gynecology

## 2014-08-31 LAB — URINALYSIS, COMPLETE
Bilirubin,UR: NEGATIVE
Blood: NEGATIVE
Glucose,UR: NEGATIVE mg/dL (ref 0–75)
Leukocyte Esterase: NEGATIVE
NITRITE: NEGATIVE
Ph: 5 (ref 4.5–8.0)
RBC,UR: 1 /HPF (ref 0–5)
SPECIFIC GRAVITY: 1.023 (ref 1.003–1.030)
Squamous Epithelial: 6
WBC UR: 2 /HPF (ref 0–5)

## 2014-09-11 ENCOUNTER — Encounter: Payer: Self-pay | Admitting: Advanced Practice Midwife

## 2014-09-11 ENCOUNTER — Ambulatory Visit (INDEPENDENT_AMBULATORY_CARE_PROVIDER_SITE_OTHER): Payer: Self-pay | Admitting: Advanced Practice Midwife

## 2014-09-11 VITALS — BP 116/73 | HR 102 | Temp 98.3°F | Wt 148.9 lb

## 2014-09-11 DIAGNOSIS — Z3480 Encounter for supervision of other normal pregnancy, unspecified trimester: Secondary | ICD-10-CM

## 2014-09-11 DIAGNOSIS — O368190 Decreased fetal movements, unspecified trimester, not applicable or unspecified: Secondary | ICD-10-CM

## 2014-09-11 DIAGNOSIS — Z3483 Encounter for supervision of other normal pregnancy, third trimester: Secondary | ICD-10-CM

## 2014-09-11 DIAGNOSIS — O368130 Decreased fetal movements, third trimester, not applicable or unspecified: Secondary | ICD-10-CM

## 2014-09-11 NOTE — Progress Notes (Signed)
More pressure this week. Decreased movement x 2 days. Was hospitalized at [facility] last week for stomach virus. So they have her records. States will deliver "wherever is closer".  NST today  >>  Reactive, Category I

## 2014-09-11 NOTE — Progress Notes (Signed)
Patient reports pelvic pressure and pain  

## 2014-09-11 NOTE — Patient Instructions (Signed)
Parto vaginal (Vaginal Delivery) Durante el parto, el mdico la ayudar a dar a luz a su beb. En elparto vaginal, deber pujar para que el beb salga por la vagina. Sin embargo, antes de que pueda sacar al beb, es necesario que ocurran ciertas cosas. La abertura del tero (cuello del tero) tiene que ablandarse, hacerse ms delgado y abrirse (dilatar) hasta que llegue a 10 cm. Adems, el beb tiene que bajar desde el tero a la vagina. SIGNOS DE TRABAJO DE PARTO  El mdico tendr primero que asegurarse de que usted est en trabajo de parto. Algunos signos son:   Eliminar lo que se llama tapn mucoso antes del inicio del trabajo de parto. Este es una pequea cantidad de mucosidad teida con sangre.  Tener contracciones uterinas regulares y dolorosas.   El tiempo entre las contracciones debe acortarse  Las molestias y el dolor se harn ms intensos gradualmente.  El dolor de las contracciones empeora al caminar y no se alivia con el reposo.   El cuello del tero se hace mas delgado (se borra) y se dilata. ANTES DEL PARTO Una vez que se inicie el trabajo de parto y sea admitida en el hospital o sanatorio, el mdico podr hacer lo siguiente:   Realizar un examen fsico.  Controlar si hay complicaciones relacionadas con el trabajo de parto.  Verificar su presin arterial, temperatura y pulso y la frecuencia cardaca (signos vitales).   Determinar si se ha roto el saco amnitico y cundo ha ocurrido.  Realizar un examen vaginal (utilizando un guante estril y un lubricante) para determinar:  La posicin (presentacin) del beb. El beb se presenta con la cabeza primero (vertex) en el canal de parto (vagina), o estn los pies o las nalgas primero (de nalgas)?  El nivel (estacin) de la cabeza del beb dentro del canal de parto.  El borramiento y la dilatacin del cuello uterino  El monitor fetal electrnico generalmente se coloca sobre el abdomen al llegar. Se utiliza para  controlar las contracciones y la frecuencia cardaca del beb.  Cuando el monitor est en el abdomen (monitor fetal externo), slo toma la frecuencia y la duracin de las contracciones. No informa acerca de la intensidad de las contracciones.  Si el mdico necesita saber exactamente la intensidad de las contracciones o cul es la frecuencia cardaca del beb, colocar un monitor interno en la vagina y el tero. El mdico comentar los riesgos y los beneficios de usar un monitor interno y le pedir autorizacin antes de colocar el dispositivo.  El monitoreo fetal continuo ser necesario si le han aplicado una epidural, si le administran ciertos medicamentos (como oxitocina) y si tiene complicaciones del embarazo o del trabajo de parto.  Podrn colocarle una va intravenosa en una vena del brazo para suministrarle lquidos y medicamentos, si es necesario. TRES ETAPAS DEL TRABAJO DE PARTO Y EL PARTO El trabajo de parto y el parto normales se dividen en tres etapas. Primera etapa Esta etapa comienza cuando comienzan las contracciones regulares y el cuello comienza a borrarse y dilatarse. Finaliza cuando el cuello est completamente abierto (completamente dilatado). La primera etapa es la etapa ms larga del trabajo de parto y puede durar desde 3 horas a 15 horas.  Algunos mtodos estn disponibles para ayudar con el dolor del parto. Usted y su mdico decidirn qu opcin es la mejor para usted. Las opciones incluyen:   Medicamentos narcticos. Estos son medicamentos fuertes que usted puede recibir a travs de una va intravenosa o   como inyeccin en el msculo. Estos medicamentos alivian el dolor pero no hacen que desaparezca completamente.  Epidural. Se administra un medicamento a travs de un tubo delgado que se inserta en la espalda. El medicamento adormece la parte inferior del cuerpo y evita el dolor en esa zona.  Bloqueo paracervical Es una inyeccin de un anestsico en cada lado del cuello  uterino.  Usted podr pedir un parto natural, que implica que no se usen analgsicos ni epidural durante el parto y el trabajo de parto. En cambio, podr tener otro tipo de ayuda como ejercicios respiratorios para hacer frente al dolor. Segunda etapa La segunda etapa del trabajo de parto comienza cuando el cuello se ha dilatado completamente a 10 cm. Contina hasta que usted puja al beb hacia abajo, por el canal de parto, y el beb nace. Esta etapa puede durar slo algunos minutos o algunas horas.  La posicin del la cabeza del beb a medida que pasa por el canal de parto, es informada como un nmero, llamado estacin. Si la cabeza del beb no ha iniciado su descenso, la estacin se describe como que est en menos 3 (-3). Cuando la cabeza del beb est en la estacin cero, est en el medio del canal de parto y se encaja en la pelvis. La estacin en la que se encuentra el beb indica el progreso de la segunda etapa del trabajo de parto.  Cuando el beb nace, el mdico lo sostendr con la cabeza hacia abajo para evitar que el lquido amnitico, el moco y la sangre entren en los pulmones del beb. La boca y la nariz del beb podrn ser succionadas con un pequeo bulbo para retirar todo lquido adicional.  El mdico podr colocar al beb sobre su estmago. Es importante evitar que el beb tome fro. Para hacerlo, el mdico secar al beb, lo colocar directamente sobre su piel, (sin mantas entre usted y el beb) y lo cubrir con mantas secas y tibias.  Se corta el cordn umbilical. Tercera etapa Durante la tercera etapa del trabajo de parto, el mdico sacar la placenta (alumbramiento) y se asegurar de que el sangrado est controlado. La salida de la placenta generalmente demora 5 minutos pero puede tardar hasta 30 minutos. Luego de la salida de la placenta, le darn un medicamento por va intravenosa o inyectable para ayudar a contraer el tero y controlar el sangrado. Si planea amamantar al beb,  puede intentar en este momento Luego de la salida de la placenta, el tero debe contraerse y quedar muy firme. Si el tero no queda firme, el mdico lo masajear. Esto es importante debido a que la contraccin del tero ayuda a cortar el sangrado en el sitio en que la placenta estaba unida al tero. Si el tero no se contrae adecuadamente ni permanece firme, podr causar un sangrado abundante. Si hay mucho sangrado, podrn darle medicamentos para contraer el tero y detener el sangrado.  Document Released: 11/17/2008 Document Revised: 04/21/2014 Exit[employer] Patient Information 2015 [employer]. This information is not intended to replace advice given to you by your health care provider. Make sure you discuss any questions you have with your health care provider.  

## 2014-09-18 ENCOUNTER — Ambulatory Visit (INDEPENDENT_AMBULATORY_CARE_PROVIDER_SITE_OTHER): Payer: Self-pay | Admitting: Family Medicine

## 2014-09-18 VITALS — BP 102/74 | HR 87 | Temp 98.2°F | Wt 149.9 lb

## 2014-09-18 DIAGNOSIS — Z3483 Encounter for supervision of other normal pregnancy, third trimester: Secondary | ICD-10-CM

## 2014-09-18 LAB — POCT URINALYSIS DIP (DEVICE)
Bilirubin Urine: NEGATIVE
Glucose, UA: NEGATIVE mg/dL
HGB URINE DIPSTICK: NEGATIVE
Nitrite: NEGATIVE
PH: 6.5 (ref 5.0–8.0)
PROTEIN: NEGATIVE mg/dL
SPECIFIC GRAVITY, URINE: 1.02 (ref 1.005–1.030)
Urobilinogen, UA: 0.2 mg/dL (ref 0.0–1.0)

## 2014-09-18 NOTE — Patient Instructions (Signed)
Tercer trimestre del embarazo  (Third Trimester of Pregnancy)  El tercer trimestre del embarazo abarca desde la semana 29 hasta la semana 42, desde el 7 mes hasta el 9. En este trimestre el beb (feto) se desarrolla muy rpidamente. Hacia el final del noveno mes, el beb que an no ha nacido mide alrededor de 20 pulgadas (45 cm) de largo. Y pesa entre 6 y 10 libras (2,700 y 4,500 kg).  CUIDADOS EN EL HOGAR   Evite fumar, consumir hierbas y beber alcohol. Evite los frmacos que no apruebe el mdico.  Slo tome los medicamentos que le haya indicado su mdico. Algunos medicamentos son seguros para tomar durante el embarazo y otros no lo son.  Haga ejercicios slo como le indique el mdico. Deje de hacer ejercicios si comienza a tener clicos.  Haga comidas regulares y sanas.  Use un sostn que le brinde buen soporte si sus mamas estn sensibles.  No utilice la baera con agua caliente, baos turcos y saunas.  Colquese el cinturn de seguridad cuando conduzca.  Evite comer carne cruda y el contacto con los utensilios y desperdicios de los gatos.  Tome las vitaminas indicadas para la etapa prenatal.  Trate de tomar medicamentos para mover el intestino (laxantes) segn lo necesario y si su mdico la autoriza. Consuma ms fibra comiendo frutas y vegetales frescos y granos enteros. Beba gran cantidad de lquido para mantener el pis (orina) de tono claro o amarillo plido.  Tome baos de agua tibia (baos de asiento) para calmar el dolor o las molestias causadas por las hemorroides. Use una crema para las hemorroides si el mdico la autoriza.  Si tiene venas hinchadas y abultadas (venas varicosas), use medias de soporte. Eleve (levante) los pies durante 15 minutos, 3 o 4 veces por da. Limite el consumo de sal en su dieta.  Evite levantar objetos pesados, usar tacones altos y sintese derecha.  Descanse con las piernas elevadas si tiene calambres o dolor de cintura.  Visite a su dentista  si no lo ha hecho durante el embarazo. Use un cepillo de dientes blando para higienizarse los dientes. Use suavemente el hilo dental.  Puede tener sexo (relaciones sexuales) siempre que el mdico la autorice.  No viaje por largas distancias si puede evitarlo. Slo hgalo con la aprobacin de su mdico.  Haga el curso pre parto.  Practique conducir hasta el hospital.  Prepare el bolso que llevar.  Prepare la habitacin del beb.  Concurra a los controles mdicos. SOLICITE AYUDA SI:   No est segura si est en trabajo de parto o ha roto la bolsa de aguas.  Tiene mareos.  Siente clicos intensos o presin en la zona baja del vientre (abdomen).  Siente un dolor persistente en la zona del vientre.  Tiene malestar estomacal (nuseas), devuelve (vomita), o tiene deposiciones acuosas (diarrea).  Advierte un olor ftido que proviene de la vagina.  Siente dolor al hacer pis (orinar). SOLICITE AYUDA DE INMEDIATO SI:   Tiene fiebre.  Pierde lquido o sangre por la vagina.  Tiene sangrando o pequeas prdidas vaginales.  Siente dolor intenso o clicos en el abdomen.  Sube o baja de peso rpidamente.  Tiene dificultad para respirar o siente dolor en el pecho.  Sbitamente se le hinchan el rostro, las manos, los tobillos, los pies o las piernas.  No ha sentido los movimientos del beb durante una hora.  Siente un dolor de cabeza intenso que no se alivia con medicamentos.  Su visin se modifica. Document   Released: 08/07/2013 Exit[employer] Patient Information 2015 [employer]. This information is not intended to replace advice given to you by your health care provider. Make sure you discuss any questions you have with your health care provider.  

## 2014-09-18 NOTE — Progress Notes (Signed)
Patient without complaints.  Denies vaginal bleeding, abnormal vaginal discharge, contractions, loss of fluid.  Denies abdominal pain, headache, scotoma.  Reports good fetal activity.  Labor precautions reviewed.  Follow up in 1 weeks.  

## 2014-09-25 ENCOUNTER — Ambulatory Visit (INDEPENDENT_AMBULATORY_CARE_PROVIDER_SITE_OTHER): Payer: Self-pay | Admitting: Advanced Practice Midwife

## 2014-09-25 ENCOUNTER — Encounter: Payer: Self-pay | Admitting: *Deleted

## 2014-09-25 ENCOUNTER — Encounter: Payer: Self-pay | Admitting: Advanced Practice Midwife

## 2014-09-25 VITALS — BP 120/77 | HR 92 | Wt 152.4 lb

## 2014-09-25 DIAGNOSIS — O480 Post-term pregnancy: Secondary | ICD-10-CM

## 2014-09-25 DIAGNOSIS — O2343 Unspecified infection of urinary tract in pregnancy, third trimester: Secondary | ICD-10-CM

## 2014-09-25 LAB — US OB FOLLOW UP

## 2014-09-25 LAB — POCT URINALYSIS DIP (DEVICE)
Bilirubin Urine: NEGATIVE
GLUCOSE, UA: NEGATIVE mg/dL
Hgb urine dipstick: NEGATIVE
Ketones, ur: NEGATIVE mg/dL
Nitrite: NEGATIVE
PH: 7 (ref 5.0–8.0)
Protein, ur: NEGATIVE mg/dL
SPECIFIC GRAVITY, URINE: 1.02 (ref 1.005–1.030)
UROBILINOGEN UA: 0.2 mg/dL (ref 0.0–1.0)

## 2014-09-25 MED ORDER — CEPHALEXIN 500 MG PO CAPS: 500.0000 mg | ORAL_CAPSULE | Freq: Four times a day (QID) | ORAL | Status: DC

## 2014-09-25 NOTE — Progress Notes (Unsigned)
IOL scheduled October 01, 2014 at 7:30am.

## 2014-09-25 NOTE — Addendum Note (Signed)
Addended by: Jill SideAY, DIANE L on: 09/25/2014 05:48 PM   Modules accepted: Orders

## 2014-09-25 NOTE — Progress Notes (Signed)
Doing well.  Good fetal movement, denies vaginal bleeding, LOF, regular contractions. Reports difficulty urinating, goes a little, feels like she can't go anymore.  Keflex QID x 7 days, urine sent for culture.  Cervical exam and membranes swept at today's visit.  Pt to NST for postdates.  Needs NST on Monday, IOL if undelivered on 10/13 or 10/14.

## 2014-09-29 ENCOUNTER — Other Ambulatory Visit: Payer: Self-pay | Admitting: Advanced Practice Midwife

## 2014-09-29 ENCOUNTER — Encounter (HOSPITAL_COMMUNITY): Payer: Self-pay | Admitting: *Deleted

## 2014-09-29 ENCOUNTER — Ambulatory Visit (INDEPENDENT_AMBULATORY_CARE_PROVIDER_SITE_OTHER): Payer: Self-pay | Admitting: *Deleted

## 2014-09-29 ENCOUNTER — Telehealth (HOSPITAL_COMMUNITY): Payer: Self-pay | Admitting: *Deleted

## 2014-09-29 VITALS — BP 118/70 | HR 88

## 2014-09-29 DIAGNOSIS — O480 Post-term pregnancy: Secondary | ICD-10-CM

## 2014-09-29 LAB — CULTURE, OB URINE

## 2014-09-29 MED ORDER — AMPICILLIN 500 MG PO CAPS: 500.0000 mg | ORAL_CAPSULE | Freq: Four times a day (QID) | ORAL | Status: DC

## 2014-09-29 NOTE — Telephone Encounter (Signed)
Preadmission screen Interpreter number [phone]

## 2014-09-29 NOTE — Progress Notes (Signed)
NST reviewed and reactive.  

## 2014-09-29 NOTE — Progress Notes (Signed)
IOL scheduled 10/14 @ 0730.  Interpreter Dorita present for visit.

## 2014-09-30 ENCOUNTER — Encounter (HOSPITAL_COMMUNITY): Payer: Medicaid Other | Admitting: Anesthesiology

## 2014-09-30 ENCOUNTER — Inpatient Hospital Stay (HOSPITAL_COMMUNITY): Payer: Medicaid Other | Admitting: Anesthesiology

## 2014-09-30 ENCOUNTER — Encounter (HOSPITAL_COMMUNITY): Payer: Self-pay | Admitting: *Deleted

## 2014-09-30 ENCOUNTER — Inpatient Hospital Stay (HOSPITAL_COMMUNITY)
Admission: AD | Admit: 2014-09-30 | Discharge: 2014-10-02 | DRG: 775 | Disposition: A | Discharge status: Home / Self Care | Payer: Medicaid Other | Source: Ambulatory Visit | Attending: Obstetrics and Gynecology | Admitting: Obstetrics and Gynecology

## 2014-09-30 ENCOUNTER — Telehealth: Payer: Self-pay | Admitting: *Deleted

## 2014-09-30 DIAGNOSIS — IMO0001 Reserved for inherently not codable concepts without codable children: Secondary | ICD-10-CM

## 2014-09-30 DIAGNOSIS — Z3A41 41 weeks gestation of pregnancy: Secondary | ICD-10-CM | POA: Diagnosis present

## 2014-09-30 DIAGNOSIS — O480 Post-term pregnancy: Principal | ICD-10-CM | POA: Diagnosis present

## 2014-09-30 DIAGNOSIS — E669 Obesity, unspecified: Secondary | ICD-10-CM | POA: Diagnosis present

## 2014-09-30 DIAGNOSIS — Z6829 Body mass index (BMI) 29.0-29.9, adult: Secondary | ICD-10-CM | POA: Diagnosis not present

## 2014-09-30 DIAGNOSIS — O99334 Smoking (tobacco) complicating childbirth: Secondary | ICD-10-CM | POA: Diagnosis present

## 2014-09-30 DIAGNOSIS — O9989 Other specified diseases and conditions complicating pregnancy, childbirth and the puerperium: Secondary | ICD-10-CM | POA: Diagnosis present

## 2014-09-30 DIAGNOSIS — O99214 Obesity complicating childbirth: Secondary | ICD-10-CM | POA: Diagnosis present

## 2014-09-30 LAB — CBC
HCT: 32.3 % — ABNORMAL LOW (ref 36.0–46.0)
Hemoglobin: 10.7 g/dL — ABNORMAL LOW (ref 12.0–15.0)
MCH: 29.9 pg (ref 26.0–34.0)
MCHC: 33.1 g/dL (ref 30.0–36.0)
MCV: 90.2 fL (ref 78.0–100.0)
Platelets: 169 10*3/uL (ref 150–400)
RBC: 3.58 MIL/uL — ABNORMAL LOW (ref 3.87–5.11)
RDW: 14.8 % (ref 11.5–15.5)
WBC: 13.6 10*3/uL — ABNORMAL HIGH (ref 4.0–10.5)

## 2014-09-30 LAB — TYPE AND SCREEN
ABO/RH(D): A POS
Antibody Screen: NEGATIVE

## 2014-09-30 MED ORDER — FENTANYL CITRATE 0.05 MG/ML IJ SOLN
100.0000 ug | INTRAMUSCULAR | Status: DC | PRN
  Filled 2014-09-30: qty 2

## 2014-09-30 MED ORDER — LACTATED RINGERS IV SOLN
500.0000 mL | Freq: Once | INTRAVENOUS | Status: AC
Start: 2014-09-30 — End: 2014-09-30
  Administered 2014-09-30: 500 mL via INTRAVENOUS

## 2014-09-30 MED ORDER — OXYCODONE-ACETAMINOPHEN 5-325 MG PO TABS: 1.0000 | ORAL_TABLET | ORAL | Status: DC | PRN

## 2014-09-30 MED ORDER — EPHEDRINE 5 MG/ML INJ
10.0000 mg | INTRAVENOUS | Status: DC | PRN
  Filled 2014-09-30: qty 2

## 2014-09-30 MED ORDER — OXYTOCIN 40 UNITS IN LACTATED RINGERS INFUSION - SIMPLE MED
1.0000 m[IU]/min | INTRAVENOUS | Status: DC
  Administered 2014-09-30: 2 m[IU]/min via INTRAVENOUS

## 2014-09-30 MED ORDER — OXYTOCIN 40 UNITS IN LACTATED RINGERS INFUSION - SIMPLE MED
62.5000 mL/h | INTRAVENOUS | Status: DC
  Filled 2014-09-30: qty 1000

## 2014-09-30 MED ORDER — PHENYLEPHRINE 40 MCG/ML (10ML) SYRINGE FOR IV PUSH (FOR BLOOD PRESSURE SUPPORT)
80.0000 ug | PREFILLED_SYRINGE | INTRAVENOUS | Status: DC | PRN
  Filled 2014-09-30: qty 2
  Filled 2014-09-30: qty 10

## 2014-09-30 MED ORDER — LIDOCAINE HCL (PF) 1 % IJ SOLN
INTRAMUSCULAR | Status: DC | PRN
  Administered 2014-09-30: 3 mL
  Administered 2014-09-30: 4 mL

## 2014-09-30 MED ORDER — TERBUTALINE SULFATE 1 MG/ML IJ SOLN: 0.2500 mg | Freq: Once | INTRAMUSCULAR | Status: AC | PRN

## 2014-09-30 MED ORDER — OXYTOCIN BOLUS FROM INFUSION: 500.0000 mL | INTRAVENOUS | Status: DC

## 2014-09-30 MED ORDER — LACTATED RINGERS IV SOLN
INTRAVENOUS | Status: DC
  Administered 2014-09-30 (×2): via INTRAVENOUS

## 2014-09-30 MED ORDER — PHENYLEPHRINE 40 MCG/ML (10ML) SYRINGE FOR IV PUSH (FOR BLOOD PRESSURE SUPPORT)
80.0000 ug | PREFILLED_SYRINGE | INTRAVENOUS | Status: DC | PRN
  Filled 2014-09-30: qty 2

## 2014-09-30 MED ORDER — DIPHENHYDRAMINE HCL 50 MG/ML IJ SOLN: 12.5000 mg | INTRAMUSCULAR | Status: DC | PRN

## 2014-09-30 MED ORDER — CITRIC ACID-SODIUM CITRATE 334-500 MG/5ML PO SOLN: 30.0000 mL | ORAL | Status: DC | PRN

## 2014-09-30 MED ORDER — ONDANSETRON HCL 4 MG/2ML IJ SOLN: 4.0000 mg | Freq: Four times a day (QID) | INTRAMUSCULAR | Status: DC | PRN

## 2014-09-30 MED ORDER — FENTANYL 2.5 MCG/ML BUPIVACAINE 1/10 % EPIDURAL INFUSION (WH - ANES)
INTRAMUSCULAR | Status: DC | PRN
  Administered 2014-09-30: 11.5 mL/h via EPIDURAL

## 2014-09-30 MED ORDER — FLEET ENEMA 7-19 GM/118ML RE ENEM: 1.0000 | ENEMA | RECTAL | Status: DC | PRN

## 2014-09-30 MED ORDER — OXYCODONE-ACETAMINOPHEN 5-325 MG PO TABS: 2.0000 | ORAL_TABLET | ORAL | Status: DC | PRN

## 2014-09-30 MED ORDER — LACTATED RINGERS IV SOLN: 500.0000 mL | INTRAVENOUS | Status: DC | PRN

## 2014-09-30 MED ORDER — FENTANYL 2.5 MCG/ML BUPIVACAINE 1/10 % EPIDURAL INFUSION (WH - ANES)
14.0000 mL/h | INTRAMUSCULAR | Status: DC | PRN
  Filled 2014-09-30: qty 125

## 2014-09-30 MED ORDER — ACETAMINOPHEN 325 MG PO TABS: 650.0000 mg | ORAL_TABLET | ORAL | Status: DC | PRN

## 2014-09-30 MED ORDER — LIDOCAINE HCL (PF) 1 % IJ SOLN
30.0000 mL | INTRAMUSCULAR | Status: DC | PRN
  Filled 2014-09-30: qty 30

## 2014-09-30 NOTE — MAU Provider Note (Signed)
[name] BoyersBeatriz [name] is a 28 y.o. female presenting for SOL, +FM, no loss of fluid, no vaginal bleeding.  Desires OCPs for contraception, would like epidural.  Maternal Medical History:  Reason for admission: Nausea.    OB History   Grav Para Term Preterm Abortions TAB SAB Ect Mult Living   6 3 3  2 1  0 1  3     Past Medical History  Diagnosis Date  . Vaginal Pap smear, abnormal    Past Surgical History  Procedure Laterality Date  . Ectopic pregnancy surgery     Family History: family history is negative for Alcohol abuse, Arthritis, Asthma, Birth defects, Cancer, COPD, Depression, Diabetes, Drug abuse, Early death, Hearing loss, Heart disease, Hyperlipidemia, Hypertension, Kidney disease, Learning disabilities, Mental illness, Mental retardation, Stroke, Vision loss, Miscarriages / Stillbirths, and Varicose Veins. Social History:  reports that she has been smoking.  She has never used smokeless tobacco. Her alcohol and drug histories are not on file.   Review of Systems  Constitutional: Negative for fever and chills.  Respiratory: Negative for cough and shortness of breath.   Cardiovascular: Negative for chest pain and leg swelling.  Gastrointestinal: Negative for heartburn, nausea, vomiting and diarrhea.  Genitourinary: Negative for dysuria, urgency, frequency and hematuria.  Neurological:       No headache    Dilation: 6.5 Effacement (%): 90 Station: -1 Exam by:: Dr. Loreta AveAcosta Blood pressure 120/82, pulse 99, temperature 99.5 F (37.5 C), temperature source Oral, resp. rate 18, height 5' (1.524 m), weight 152 lb (68.947 kg), SpO2 100.00%. Exam Physical Exam  Constitutional: She is oriented to person, place, and time. She appears well-developed and well-nourished. She appears distressed (moderate with contractions).  HENT:  Head: Normocephalic and atraumatic.  Eyes: Conjunctivae and EOM are normal.  Neck: Normal range of motion.  Cardiovascular: Normal rate.   Respiratory:  Effort normal. No respiratory distress.  GI: Soft. Bowel sounds are normal. She exhibits no distension. There is no tenderness.  Musculoskeletal: Normal range of motion. She exhibits no edema.  Neurological: She is alert and oriented to person, place, and time.  Skin: Skin is warm and dry. No erythema.   Dilation: 6.5 Effacement (%): 90 Station: -1 Presentation: Vertex Exam by:: Dr. Loreta AveAcosta  Prenatal labs: ABO, Rh: A/POS/-- (04/15 1038) Antibody: NEG (04/15 1038) Rubella: 2.34 (04/15 1038) RPR: NON REAC (07/07 1623)  HBsAg: NEGATIVE (04/15 1038)  HIV: NONREACTIVE (07/07 1623)  GBS: Negative (09/09 0000)  1h glucola 123  Clinic  [facility]  Dating  6 wk ultrasound  Genetic Screen           Quad:       Elevated DSR 1:258 (Declined further testing) NIPS: Declined  Anatomic US  nl  GTT Early:               Third trimester: 123  TDaP vaccine 08/11/14  Flu vaccine  08/27/14  GBS  negative   Contraception pills  Baby Food  Breast  Circumcision  Undecided  Pediatrician   Support Person  Father of baby      Assessment/Plan: Admit to birthing suites GBS neg MOF: breast and bottle MOC: OCPs FWB: cat I Labor: progressing normally, will rupture if needed at next exam, currently laboring in MAU, transfer to birthing suites as soon as room available if delivery not eminent.   [name] [name] [name] [name] 09/30/2014, 8:32 PM

## 2014-09-30 NOTE — Telephone Encounter (Signed)
Received message from Sharen CounterLisa Leftwich-Kirby that she had prescribed alternate antibiotic for pt's UTI and pt needed to be notified.  Per chart review pt is scheduled for IOL tomorrow and I consulted with Dr. Debroah LoopArnold regarding pt's medication needs. He stated that pt should keep taking the Keflex- originally ordered and not pick up the ampicillin.  She will receive treatment while inpatient.  I called pt with [employer] interpreter # [phone].  Pt stated that she did not pick up the original Rx of Keflex and is not taking any antibiotic. I advised her that she has a bladder infection and she will receive treatment while in the hospital this week. She should not pick up Rx if she receives a call from her pharmacy. Pt voiced understanding. Pt's pharmacy was also notified.

## 2014-09-30 NOTE — Anesthesia Procedure Notes (Signed)
Epidural Patient location during procedure: OB Start time: 09/30/2014 9:28 PM  Staffing Anesthesiologist: Catlin Aycock A. Performed by: anesthesiologist   Preanesthetic Checklist Completed: patient identified, site marked, surgical consent, pre-op evaluation, timeout performed, IV checked, risks and benefits discussed and monitors and equipment checked  Epidural Patient position: sitting Prep: site prepped and draped and DuraPrep Patient monitoring: continuous pulse ox and blood pressure Approach: midline Location: L3-L4 Injection technique: LOR air  Needle:  Needle type: Tuohy  Needle gauge: 17 G Needle length: 9 cm and 9 Needle insertion depth: 5 cm cm Catheter type: closed end flexible Catheter size: 19 Gauge Catheter at skin depth: 10 cm Test dose: negative and Other  Assessment Events: blood not aspirated, injection not painful, no injection resistance, negative IV test and no paresthesia  Additional Notes Patient identified. Risks and benefits discussed including failed block, incomplete  Pain control, post dural puncture headache, nerve damage, paralysis, blood pressure Changes, nausea, vomiting, reactions to medications-both toxic and allergic and post Partum back pain. All questions were answered. Patient expressed understanding and wished to proceed. Sterile technique was used throughout procedure. Epidural site was Dressed with sterile barrier dressing. No paresthesias, signs of intravascular injection Or signs of intrathecal spread were encountered. Attempt x 1. Spanish interpreter present for procedure. Patient was more comfortable after the epidural was dosed. Please see RN's note for documentation of vital signs and FHR which are stable.

## 2014-09-30 NOTE — Anesthesia Preprocedure Evaluation (Addendum)
Anesthesia Evaluation  Patient identified by MRN, date of birth, ID band Patient awake    Reviewed: Allergy & Precautions, H&P , Patient's Chart, lab work & pertinent test results  Airway Mallampati: III TM Distance: >3 FB Neck ROM: Full    Dental no notable dental hx. (+) Teeth Intact   Pulmonary Current Smoker,  breath sounds clear to auscultation  Pulmonary exam normal       Cardiovascular negative cardio ROS  Rhythm:Regular Rate:Normal     Neuro/Psych negative neurological ROS  negative psych ROS   GI/Hepatic negative GI ROS, Neg liver ROS,   Endo/Other  Obesity   Renal/GU negative Renal ROS  negative genitourinary   Musculoskeletal negative musculoskeletal ROS (+)   Abdominal (+) + obese,   Peds  Hematology  (+) anemia ,   Anesthesia Other Findings   Reproductive/Obstetrics (+) Pregnancy                          Anesthesia Physical Anesthesia Plan  ASA: II  Anesthesia Plan: Epidural   Post-op Pain Management:    Induction:   Airway Management Planned: Natural Airway  Additional Equipment:   Intra-op Plan:   Post-operative Plan:   Informed Consent: I have reviewed the patients History and Physical, chart, labs and discussed the procedure including the risks, benefits and alternatives for the proposed anesthesia with the patient or authorized representative who has indicated his/her understanding and acceptance.     Plan Discussed with: Anesthesiologist  Anesthesia Plan Comments:         Anesthesia Quick Evaluation

## 2014-09-30 NOTE — Plan of Care (Signed)
Problem: Consults Goal: Birthing Suites Patient Information Press F2 to bring up selections list  Outcome: Completed/Met Date Met:  09/30/14  Pt > [redacted] weeks EGA and Non-English Speaking-Spanish interpreter used at bedside

## 2014-09-30 NOTE — MAU Note (Signed)
Contractions since 4 am

## 2014-10-01 ENCOUNTER — Encounter (HOSPITAL_COMMUNITY): Payer: Self-pay

## 2014-10-01 ENCOUNTER — Inpatient Hospital Stay (HOSPITAL_COMMUNITY): Admission: RE | Admit: 2014-10-01 | Payer: Self-pay | Source: Ambulatory Visit

## 2014-10-01 DIAGNOSIS — Z3A41 41 weeks gestation of pregnancy: Secondary | ICD-10-CM

## 2014-10-01 LAB — CBC
HCT: 28.9 % — ABNORMAL LOW (ref 36.0–46.0)
HEMOGLOBIN: 9.5 g/dL — AB (ref 12.0–15.0)
MCH: 29.4 pg (ref 26.0–34.0)
MCHC: 32.9 g/dL (ref 30.0–36.0)
MCV: 89.5 fL (ref 78.0–100.0)
PLATELETS: 155 10*3/uL (ref 150–400)
RBC: 3.23 MIL/uL — AB (ref 3.87–5.11)
RDW: 15 % (ref 11.5–15.5)
WBC: 14.8 10*3/uL — ABNORMAL HIGH (ref 4.0–10.5)

## 2014-10-01 LAB — ABO/RH: ABO/RH(D): A POS

## 2014-10-01 LAB — RPR

## 2014-10-01 MED ORDER — BENZOCAINE-MENTHOL 20-0.5 % EX AERO: 1.0000 "application " | INHALATION_SPRAY | CUTANEOUS | Status: DC | PRN

## 2014-10-01 MED ORDER — DIBUCAINE 1 % RE OINT: 1.0000 "application " | TOPICAL_OINTMENT | RECTAL | Status: DC | PRN

## 2014-10-01 MED ORDER — TETANUS-DIPHTH-ACELL PERTUSSIS 5-2.5-18.5 LF-MCG/0.5 IM SUSP: 0.5000 mL | Freq: Once | INTRAMUSCULAR | Status: DC

## 2014-10-01 MED ORDER — OXYCODONE-ACETAMINOPHEN 5-325 MG PO TABS
1.0000 | ORAL_TABLET | ORAL | Status: DC | PRN
  Administered 2014-10-01 – 2014-10-02 (×4): 1 via ORAL
  Administered 2014-10-02: 2 via ORAL
  Filled 2014-10-01: qty 1
  Filled 2014-10-01: qty 2
  Filled 2014-10-01 (×4): qty 1

## 2014-10-01 MED ORDER — SENNOSIDES-DOCUSATE SODIUM 8.6-50 MG PO TABS
2.0000 | ORAL_TABLET | ORAL | Status: DC
  Administered 2014-10-01: 2 via ORAL
  Filled 2014-10-01: qty 2

## 2014-10-01 MED ORDER — LANOLIN HYDROUS EX OINT: TOPICAL_OINTMENT | CUTANEOUS | Status: DC | PRN

## 2014-10-01 MED ORDER — PNEUMOCOCCAL VAC POLYVALENT 25 MCG/0.5ML IJ INJ
0.5000 mL | INJECTION | INTRAMUSCULAR | Status: AC
Start: 2014-10-02 — End: 2014-10-01
  Administered 2014-10-01: 0.5 mL via INTRAMUSCULAR
  Filled 2014-10-01 (×2): qty 0.5

## 2014-10-01 MED ORDER — OXYCODONE-ACETAMINOPHEN 5-325 MG PO TABS
1.0000 | ORAL_TABLET | Freq: Once | ORAL | Status: AC
  Administered 2014-10-01: 1 via ORAL

## 2014-10-01 MED ORDER — SIMETHICONE 80 MG PO CHEW: 80.0000 mg | CHEWABLE_TABLET | ORAL | Status: DC | PRN

## 2014-10-01 MED ORDER — ZOLPIDEM TARTRATE 5 MG PO TABS: 5.0000 mg | ORAL_TABLET | Freq: Every evening | ORAL | Status: DC | PRN

## 2014-10-01 MED ORDER — IBUPROFEN 600 MG PO TABS
600.0000 mg | ORAL_TABLET | Freq: Four times a day (QID) | ORAL | Status: DC
  Administered 2014-10-01 – 2014-10-02 (×7): 600 mg via ORAL
  Filled 2014-10-01 (×8): qty 1

## 2014-10-01 MED ORDER — WITCH HAZEL-GLYCERIN EX PADS: 1.0000 "application " | MEDICATED_PAD | CUTANEOUS | Status: DC | PRN

## 2014-10-01 MED ORDER — ONDANSETRON HCL 4 MG PO TABS: 4.0000 mg | ORAL_TABLET | ORAL | Status: DC | PRN

## 2014-10-01 MED ORDER — PRENATAL MULTIVITAMIN CH
1.0000 | ORAL_TABLET | Freq: Every day | ORAL | Status: DC
  Administered 2014-10-01 – 2014-10-02 (×2): 1 via ORAL
  Filled 2014-10-01 (×2): qty 1

## 2014-10-01 MED ORDER — ONDANSETRON HCL 4 MG/2ML IJ SOLN: 4.0000 mg | INTRAMUSCULAR | Status: DC | PRN

## 2014-10-01 MED ORDER — DIPHENHYDRAMINE HCL 25 MG PO CAPS: 25.0000 mg | ORAL_CAPSULE | Freq: Four times a day (QID) | ORAL | Status: DC | PRN

## 2014-10-01 NOTE — Progress Notes (Signed)
Stopped by to check on patient's needs and ordered her lunch. Eda H Royal  Interpreter. °

## 2014-10-01 NOTE — Progress Notes (Signed)
I was present at the room  And I Assisted Dr Loreta AveAcosta  With  instructions , BY Orlan LeavensViria Alvarez , Interpreter

## 2014-10-01 NOTE — Lactation Note (Signed)
This note was copied from the chart of [name] [name]. Lactation Consultation Note  Initial visit done.  Breastfeeding consultation services and support information given to patient.  Mom states baby is latching well.  She is supplementing with small amounts of formula.  Mom educated on presence of colostrum and milk coming to volume in 3-5 days.  Encouraged parents to try to avoid formula at this time so it will not interfere with milk supply.  Encouraged to call with concerns/assist.  Patient Name: [name] [name] [name] [id number]'UToday's Date: 10/01/2014 Reason for consult: Initial assessment   Maternal Data Does the patient have breastfeeding experience prior to this delivery?: Yes  Feeding Feeding Type: Bottle Fed - Formula Length of feed: 30 min  LATCH Score/Interventions Latch: Grasps breast easily, tongue down, lips flanged, rhythmical sucking.  Audible Swallowing: A few with stimulation Intervention(s): Skin to skin  Type of Nipple: Everted at rest and after stimulation  Comfort (Breast/Nipple): Soft / non-tender     Hold (Positioning): No assistance needed to correctly position infant at breast.  LATCH Score: 9  Lactation Tools Discussed/Used     Consult Status Consult Status: PRN    Huston FoleyMOULDEN, Shirlie Enck S 10/01/2014, 1:38 PM

## 2014-10-01 NOTE — MAU Provider Note (Signed)
Attestation of Attending Supervision of Advanced Practitioner (CNM/NP): Evaluation and management procedures were performed by the Advanced Practitioner under my supervision and collaboration.  I have reviewed the Advanced Practitioner's note and chart, and I agree with the management and plan.  Bernon Arviso 10/01/2014 1:35 AM

## 2014-10-01 NOTE — Progress Notes (Signed)
I stopped by patients room to check on her needs. By Viria Alvarez, Interpreter °

## 2014-10-01 NOTE — Progress Notes (Signed)
I assisted Rea CollegeJessica Rn  and Research scientist (physical sciences)Linda RN with some assessement about baby and mom, and explain about differents formula to feed the baby. By Orlan LeavensViria Alvarez, Interpreter

## 2014-10-01 NOTE — Progress Notes (Signed)
Stopped by to check on patient's needs and helped Maralyn SagoSarah NT explain about the hearing screen. Kipp LaurenceEda H Royal Interpreter

## 2014-10-01 NOTE — Progress Notes (Signed)
[name] BoyersBeatriz [name] is a 28 y.o. [id number] at 2232w1d  admitted for active labor  Subjective: Feeling more pressure. No complaints otherwise.   Objective: Filed Vitals:   09/30/14 2249 09/30/14 2300 09/30/14 2330 10/01/14 0000  BP: 116/49 107/57 106/56 138/84  Pulse: 79 80 74 90  Temp: 98.5 F (36.9 C)     TempSrc: Oral     Resp: 18 18 18 18   Height:      Weight:      SpO2:          FHT:  FHR: 135 bpm, variability: moderate,  accelerations:  Present,  decelerations:  Absent UC:   regular, every 2 minutes SVE:   Dilation: 10 Effacement (%): 100 Station: 0;+1 Exam by:: e. poore ,rn Pitocin @ 6 mu/min  Labs: Lab Results  Component Value Date   WBC 13.6* 09/30/2014   HGB 10.7* 09/30/2014   HCT 32.3* 09/30/2014   MCV 90.2 09/30/2014   PLT 169 09/30/2014    Assessment / Plan: Spontaneous labor, progressing normally  Labor: Progressing normally on pitocin s/p AROM Fetal Wellbeing:  Category I Pain Control:  Epidural Anticipated MOD:  NSVD  Jacquiline Doearker, Caleb 10/01/2014, 12:34 AM

## 2014-10-01 NOTE — Progress Notes (Signed)
UR completed 

## 2014-10-01 NOTE — H&P (Signed)
[name] BoyersBeatriz [name] is a 28 y.o. female presenting for SOL, +FM, no loss of fluid, no vaginal bleeding. Desires OCPs for contraception, would like epidural.  Maternal Medical History:  Reason for admission: Nausea.  OB History    Grav  Para  Term  Preterm  Abortions  TAB  SAB  Ect  Mult  Living    6  3  3   2  1   0  1   3      Past Medical History   Diagnosis  Date   .  Vaginal Pap smear, abnormal     Past Surgical History   Procedure  Laterality  Date   .  Ectopic pregnancy surgery     Family History: family history is negative for Alcohol abuse, Arthritis, Asthma, Birth defects, Cancer, COPD, Depression, Diabetes, Drug abuse, Early death, Hearing loss, Heart disease, Hyperlipidemia, Hypertension, Kidney disease, Learning disabilities, Mental illness, Mental retardation, Stroke, Vision loss, Miscarriages / Stillbirths, and Varicose Veins.  Social History: reports that she has been smoking. She has never used smokeless tobacco. Her alcohol and drug histories are not on file.  Review of Systems  Constitutional: Negative for fever and chills.  Respiratory: Negative for cough and shortness of breath.  Cardiovascular: Negative for chest pain and leg swelling.  Gastrointestinal: Negative for heartburn, nausea, vomiting and diarrhea.  Genitourinary: Negative for dysuria, urgency, frequency and hematuria.  Neurological:  No headache  Dilation: 6.5  Effacement (%): 90  Station: -1  Exam by:: Dr. Loreta AveAcosta  Blood pressure 120/82, pulse 99, temperature 99.5 F (37.5 C), temperature source Oral, resp. rate 18, height 5' (1.524 m), weight 152 lb (68.947 kg), SpO2 100.00%.  Exam  Physical Exam  Constitutional: She is oriented to person, place, and time. She appears well-developed and well-nourished. She appears distressed (moderate with contractions).  HENT:  Head: Normocephalic and atraumatic.  Eyes: Conjunctivae and EOM are normal.  Neck: Normal range of motion.  Cardiovascular: Normal rate.   Respiratory: Effort normal. No respiratory distress.  GI: Soft. Bowel sounds are normal. She exhibits no distension. There is no tenderness.  Musculoskeletal: Normal range of motion. She exhibits no edema.  Neurological: She is alert and oriented to person, place, and time.  Skin: Skin is warm and dry. No erythema.  Dilation: 6.5  Effacement (%): 90  Station: -1  Presentation: Vertex  Exam by:: Dr. Loreta AveAcosta  Prenatal labs:  ABO, Rh: A/POS/-- (04/15 1038)  Antibody: NEG (04/15 1038)  Rubella: 2.34 (04/15 1038)  RPR: NON REAC (07/07 1623)  HBsAg: NEGATIVE (04/15 1038)  HIV: NONREACTIVE (07/07 1623)  GBS: Negative (09/09 0000)  1h glucola 123  [facility]   Dating  6 wk ultrasound   Genetic Screen  Quad: Elevated DSR 1:258 (Declined further testing) NIPS: Declined   Anatomic US  nl   GTT  Early: Third trimester: 123   TDaP vaccine  08/11/14   Flu vaccine  08/27/14   GBS  negative   Contraception  pills   Baby Food  Breast   Circumcision  Undecided   Pediatrician    Support Person  Father of baby   Assessment/Plan:  Admit to birthing suites  GBS neg  MOF: breast and bottle  MOC: OCPs  FWB: cat I  Labor: progressing normally, will rupture if needed at next exam, currently laboring in MAU, transfer to birthing suites as soon as room available if delivery not eminent.

## 2014-10-02 MED ORDER — IBUPROFEN 600 MG PO TABS: 600.0000 mg | ORAL_TABLET | Freq: Four times a day (QID) | ORAL | Status: DC

## 2014-10-02 MED ORDER — OXYCODONE-ACETAMINOPHEN 5-325 MG PO TABS: 1.0000 | ORAL_TABLET | ORAL | Status: DC | PRN

## 2014-10-02 MED ORDER — DOCUSATE SODIUM 100 MG PO CAPS: 100.0000 mg | ORAL_CAPSULE | Freq: Two times a day (BID) | ORAL | Status: DC

## 2014-10-02 NOTE — Progress Notes (Signed)
Stopped by to check on patient's needs and ordered her lunch. Eda H Royal  Interpreter. °

## 2014-10-02 NOTE — Discharge Summary (Signed)
`````  Attestation of Attending Supervision of Advanced Practitioner: Evaluation and management procedures were performed by the PA/NP/CNM/OB Fellow under my supervision/collaboration. Chart reviewed and agree with management and plan.  Marsh Heckler V 10/02/2014 11:32 AM

## 2014-10-02 NOTE — Discharge Summary (Signed)
Obstetric Discharge Summary Reason for Admission: onset of labor Prenatal Procedures: none Intrapartum Procedures: spontaneous vaginal delivery Postpartum Procedures: none Complications-Operative and Postpartum: none  Hospital Course:  Patient was admitted with SOL. She progressed well with pitocin augmentation and AROM.   Delivery Note At 1:47 AM a viable female was delivered via Vaginal, Spontaneous Delivery (Presentation: Right Occiput Anterior). APGAR: 8, ; weight pending .  Placenta status: Intact, Spontaneous. Cord: 3 vessels with the following complications: None. Cord pH: n/a  Anesthesia: Epidural  Episiotomy: None  Lacerations: None  Est. Blood Loss (mL): 250  Mom to postpartum. Baby to Couplet care / Skin to Skin.  Jacquiline Doearker, Caleb  10/01/2014, 2:27 AM  Patient is 28 y.o. [id number] 2168w1d admitted with SOL  I was gloved and present for delivery in its entirety. Second stage of labor progressed, baby delivered after few contractions. Marked variability with few decels during second stage noted.  Complications: none  Lacerations: none  EBL: 250mL  ACOSTA,KRISTY ROCIO, MD  2:28 AM   Postpartum, the patient did well with no complications.  On the day of discharge,  Pt denied problems with ambulating, voiding or po intake.  She denied nausea or vomiting.  Pain wass moderately controlled.  She has had flatus. She has not had bowel movement.  Lochia Small.     H/H: Lab Results  Component Value Date/Time   HGB 9.5* 10/01/2014  6:05 AM   HCT 28.9* 10/01/2014  6:05 AM    Filed Vitals:   10/02/14 0540  BP: 92/54  Pulse: 66  Temp: 97.8 F (36.6 C)  Resp:     Physical Exam: VSS NAD Abd: Appropriately tender, ND, Fundus firm No c/c/e, Neg homan's sign, neg cords Lochia Appropriate  Discharge Diagnoses: Post-date pregnancy, delivered  Discharge Information: Date: 10/02/2014 Activity: pelvic rest Diet: routine  Medications: Ibuprofen, Colace and Percocet Breast  feeding:  Yes Condition: stable Instructions: refer to handout Discharge to: home      Medication List         docusate sodium 100 MG capsule  Commonly known as:  COLACE  Take 1 capsule (100 mg total) by mouth 2 (two) times daily.     ibuprofen 600 MG tablet  Commonly known as:  ADVIL,MOTRIN  Take 1 tablet (600 mg total) by mouth every 6 (six) hours.     oxyCODONE-acetaminophen 5-325 MG per tablet  Commonly known as:  PERCOCET/ROXICET  Take 1 tablet by mouth every 4 (four) hours as needed for moderate pain or severe pain (1 tab pain score <7, 2 tabs pain score >7).           Follow-up Information   [facility] (for post partum follow up)    Specialty:  Obstetrics and Gynecology   Contact information:   [address] [place] [address] [phone]      Jacquiline DoeParker, Caleb 10/02/2014,8:37 AM  I have seen and examined this patient and I agree with the above. Cam HaiSHAW, KIMBERLY CNM 8:43 AM 10/02/2014

## 2014-10-02 NOTE — Progress Notes (Signed)
Stopped by to check on patient's needs. Eda H Royal Interpreter. °

## 2014-10-02 NOTE — Progress Notes (Signed)
Clinical Social Work Department PSYCHOSOCIAL ASSESSMENT - MATERNAL/CHILD 10/02/2014  Patient:  [name],[name]  Account Number:  [id number]  Admit Date:  09/30/2014  Childs Name:   [name] [name]   Clinical Social Worker:  Lataja Newland, CLINICAL SOCIAL WORKER   Date/Time:  10/02/2014 01:45 PM  Date Referred:  10/01/2014   Referral source  Central Nursery     Referred reason  Depression/Anxiety   Other referral source:    I:  FAMILY / HOME ENVIRONMENT Child's legal guardian:  PARENT  Guardian - Name Guardian - Age Guardian - Address  [name] [name] 28 [address] [place], [place] [address]  [name] [name]  same as above   Other household support members/support persons Name Relationship DOB   SON 2003   DAUGHTER 2004   DAUGHTER 2008   Other support:   MOB reported feeling well supported.    II  PSYCHOSOCIAL DATA Information Source:  Patient Interview  Financial and Community Resources Employment:   MOB stated that she is currently unemployed but intends to begin to work for work once she has recovered from giving birth.   Financial resources:  Self Pay If Medicaid - County:    School / Grade:  N/A Maternity Care Coordinator / Child Services Coordination / Early Interventions:   None reported  Cultural issues impacting care:   None reported.    III  STRENGTHS Strengths  Adequate Resources  Home prepared for Child (including basic supplies)  Supportive family/friends   Strength comment:    IV  RISK FACTORS AND CURRENT PROBLEMS Current Problem:  YES   Risk Factor & Current Problem Patient Issue Family Issue Risk Factor / Current Problem Comment  Mental Illness N N MOB's chart documents histry of anxiety, but MOB denied mental health history.    V  SOCIAL WORK ASSESSMENT CSW met with the MOB in order room in order to complete the assessment.  Consult was requested to MOB presenting with a history of anxiety.  Assessment completed with assistance of interpreter.   MOB was receptive to the visit and was easily engaged.  She displayed an appropriate range in affect and presented in a pleasant, but tired mood.    CSW assisted MOB process her thoughts and feelings as she transitions into the postpartum period.  She shared that she feels happy when she looks at her son, and is looking forward to having another newborn.  She reflected upon originally feeling overwhelmed when she first learned that she was pregnant since it had been a long period of time since she had had a newborn, but discussed that she became excited as the pregnancy progressed.  She shared that she enjoys being a mother, and that all of her family members are excited about having another baby.  MOB denied any psychosocial stressors during her pregnancy, and shared belief that her life has been stable.   MOB denied mental health history and denied history of postpartum period.  She is unsure why it is documented in her chart that she has a history of anxiety.  She denied ever reporting to her medical providers that she feels nervous, anxious, or worried.  MOB did not present as nervous or anxious, and CSW did not hear any thought processes that are congruent with anxious thought processes.  MOB was receptive to education on postpartum depression and was agreeable to contacting her MD if she experiences symptoms.   No barriers to discharge.     VI SOCIAL WORK PLAN Social   Work Plan  Patient/Family Education  No Further Intervention Required / No Barriers to Discharge   Type of pt/family education:   Postpartum depression   If child protective services report - county:   If child protective services report - date:   Information/referral to community resources comment:   No referrals needed at this time.   Other social work plan:   CSW to provide emotional support PRN.     

## 2014-10-02 NOTE — Discharge Instructions (Signed)

## 2014-10-02 NOTE — Progress Notes (Signed)
I assisted Risk analystThaih RN with explanation about PKU shot, By Orlan LeavensViria Alvarez, Interpreter

## 2014-10-02 NOTE — Anesthesia Postprocedure Evaluation (Signed)
  Anesthesia Post-op Note  Patient: Francene BoyersBeatriz Sandhu  Procedure(s) Performed: * No procedures listed *  Patient Location: Mother/Baby  Anesthesia Type:Epidural  Level of Consciousness: awake, alert , oriented and patient cooperative  Airway and Oxygen Therapy: Patient Spontanous Breathing  Post-op Pain: none  Post-op Assessment: Post-op Vital signs reviewed, Patient's Cardiovascular Status Stable, Respiratory Function Stable, Patent Airway, No signs of Nausea or vomiting, Adequate PO intake, Pain level controlled and No headache  Post-op Vital Signs: Reviewed and stable  Last Vitals:  Filed Vitals:   10/02/14 0540  BP: 92/54  Pulse: 66  Temp: 36.6 C  Resp:     Complications: No apparent anesthesia complications

## 2014-10-03 ENCOUNTER — Inpatient Hospital Stay (HOSPITAL_COMMUNITY)
Admission: AD | Admit: 2014-10-03 | Discharge: 2014-10-03 | Disposition: A | Discharge status: Home / Self Care | Payer: Self-pay | Source: Ambulatory Visit | Attending: Family Medicine | Admitting: Family Medicine

## 2014-10-03 ENCOUNTER — Encounter (HOSPITAL_COMMUNITY): Payer: Self-pay | Admitting: *Deleted

## 2014-10-03 DIAGNOSIS — O9989 Other specified diseases and conditions complicating pregnancy, childbirth and the puerperium: Secondary | ICD-10-CM | POA: Insufficient documentation

## 2014-10-03 DIAGNOSIS — R600 Localized edema: Secondary | ICD-10-CM | POA: Insufficient documentation

## 2014-10-03 DIAGNOSIS — N949 Unspecified condition associated with female genital organs and menstrual cycle: Secondary | ICD-10-CM | POA: Insufficient documentation

## 2014-10-03 DIAGNOSIS — O26899 Other specified pregnancy related conditions, unspecified trimester: Secondary | ICD-10-CM

## 2014-10-03 DIAGNOSIS — R102 Pelvic and perineal pain: Secondary | ICD-10-CM

## 2014-10-03 DIAGNOSIS — O1205 Gestational edema, complicating the puerperium: Secondary | ICD-10-CM

## 2014-10-03 NOTE — MAU Provider Note (Signed)
Attestation of Attending Supervision of Advanced Practitioner (PA/CNM/NP): Evaluation and management procedures were performed by the Advanced Practitioner under my supervision and collaboration.  I have reviewed the Advanced Practitioner's note and chart, and I agree with the management and plan.  Jacob Stinson, DO Attending Physician Faculty Practice, [facility]  

## 2014-10-03 NOTE — MAU Note (Signed)
Patient states she had a SVD on 10-14, home yesterday. States she had a sharp pain this am, not now. States her feel and legs are swelling more since last night.

## 2014-10-03 NOTE — Discharge Instructions (Signed)

## 2014-10-03 NOTE — MAU Provider Note (Signed)
History     CSN: [id number]  Arrival date and time: 10/03/14 1714   First Provider Initiated Contact with Patient 10/03/14 1903      Chief Complaint  Patient presents with  . Abdominal Pain  . Leg Swelling   Abdominal Pain    [name] BoyersBeatriz Fleek is a 28 y.o. [id number] s/p NSVD on 10/14. She was DC home on 10/15. She states that today her feet and legs have been getting more swollen throughout the day today. She states that she also had a pain this morning that felt like she was in labor. She took some tylenol, and the pain was better. She states that her bleeding has been very light. She is breastfeeding without difficulty.   Past Medical History  Diagnosis Date  . Vaginal Pap smear, abnormal     Past Surgical History  Procedure Laterality Date  . Ectopic pregnancy surgery      Family History  Problem Relation Age of Onset  . Alcohol abuse Neg Hx   . Arthritis Neg Hx   . Asthma Neg Hx   . Birth defects Neg Hx   . Cancer Neg Hx   . COPD Neg Hx   . Depression Neg Hx   . Diabetes Neg Hx   . Drug abuse Neg Hx   . Early death Neg Hx   . Hearing loss Neg Hx   . Heart disease Neg Hx   . Hyperlipidemia Neg Hx   . Hypertension Neg Hx   . Kidney disease Neg Hx   . Learning disabilities Neg Hx   . Mental illness Neg Hx   . Mental retardation Neg Hx   . Stroke Neg Hx   . Vision loss Neg Hx   . Miscarriages / Stillbirths Neg Hx   . Varicose Veins Neg Hx     History  Substance Use Topics  . Smoking status: Current Every Day Smoker -- 0.25 packs/day    Types: Cigarettes  . Smokeless tobacco: Never Used  . Alcohol Use: No    Allergies: No Known Allergies  Prescriptions prior to admission  Medication Sig Dispense Refill  . docusate sodium (COLACE) 100 MG capsule Take 1 capsule (100 mg total) by mouth 2 (two) times daily.  30 capsule  0  . ibuprofen (ADVIL,MOTRIN) 600 MG tablet Take 1 tablet (600 mg total) by mouth every 6 (six) hours.  30 tablet  0  .  oxyCODONE-acetaminophen (PERCOCET/ROXICET) 5-325 MG per tablet Take 1 tablet by mouth every 4 (four) hours as needed for moderate pain or severe pain (1 tab pain score <7, 2 tabs pain score >7).  10 tablet  0    Review of Systems  Gastrointestinal: Positive for abdominal pain.   Physical Exam   Blood pressure 112/69, pulse 76, temperature 99.3 F (37.4 C), temperature source Oral, resp. rate 16, weight 64.229 kg (141 lb 9.6 oz), SpO2 99.00%, unknown if currently breastfeeding.  Physical Exam  Nursing note and vitals reviewed. Constitutional: She is oriented to person, place, and time. She appears well-developed and well-nourished. No distress.  Cardiovascular: Normal rate.   Respiratory: Effort normal.  GI: Soft. There is no tenderness. There is no rebound.  Musculoskeletal: She exhibits edema (1+ pedal edema to mid-calf). She exhibits no tenderness (no erythema, negative homans sign).  Neurological: She is alert and oriented to person, place, and time.  Skin: Skin is warm and dry.  Psychiatric: She has a normal mood and affect.    MAU Course  Procedures    Assessment and Plan   1. Pelvic pain complicating pregnancy   2. Edema in pregnancy, postpartum condition    PP care reviewed Comfort measures for afterpains and edema Return to MAU as needed  Follow-up Information   [facility]. (As scheduled)    Contact information:   [address] [place] [address] [phone]       Tawnya CrookHogan, Heather Donovan 10/03/2014, 7:19 PM

## 2014-10-09 ENCOUNTER — Encounter (HOSPITAL_COMMUNITY): Payer: Self-pay | Admitting: *Deleted

## 2014-10-20 ENCOUNTER — Encounter (HOSPITAL_COMMUNITY): Payer: Self-pay | Admitting: *Deleted

## 2014-10-27 ENCOUNTER — Encounter: Payer: Self-pay | Admitting: Obstetrics & Gynecology

## 2014-10-27 ENCOUNTER — Ambulatory Visit (INDEPENDENT_AMBULATORY_CARE_PROVIDER_SITE_OTHER): Payer: Self-pay | Admitting: Obstetrics & Gynecology

## 2014-10-27 MED ORDER — NORETHINDRONE 0.35 MG PO TABS
1.0000 | ORAL_TABLET | Freq: Every day | ORAL | Status: DC
Start: 2014-10-27 — End: 2014-10-27

## 2014-10-27 MED ORDER — NORETHINDRONE 0.35 MG PO TABS: 1.0000 | ORAL_TABLET | Freq: Every day | ORAL | Status: DC

## 2014-10-27 NOTE — Progress Notes (Signed)
  Subjective:     [name] [name] is a 28 y.o. female who presents for a postpartum visit. She is 6 weeks postpartum following a spontaneous vaginal delivery. I have fully reviewed the prenatal and intrapartum course. The delivery was at 41 gestational weeks. Outcome: spontaneous vaginal delivery. Anesthesia: epidural. Postpartum course has been uneventful. Baby's course has been uneventful. Baby is feeding by breast and formula. Bleeding no bleeding. Bowel function is normal. Bladder function is normal. Patient is not sexually active. Contraception method is oral progesterone-only contraceptive. Postpartum depression screening: negative.  The following portions of the patient's history were reviewed and updated as appropriate: allergies, current medications, past family history, past medical history, past social history, past surgical history and problem list.  Review of Systems Pertinent items are noted in HPI.   Objective:    BP 94/65 mmHg  Pulse 62  Temp(Src) 98.1 F (36.7 C)  Wt 128 lb 8 oz (58.287 kg)  Breastfeeding? Yes  General:  alert, cooperative and no distress   Breasts:  inspection negative, no nipple discharge or bleeding, no masses or nodularity palpable  Lungs: clear to auscultation bilaterally  Heart:  regular rate and rhythm  Abdomen: soft, non-tender; bowel sounds normal; no masses,  no organomegaly   Vulva:  normal  Vagina: normal vagina  Cervix:  no lesions  Corpus: normal and tender  Adnexa:  normal adnexa  Rectal Exam: Not performed.        Assessment:     Nml postpartum exam.   Plan:    1. Contraception: oral progesterone-only contraceptive 2. Warned about low efficacy of POPs 3. Follow up in: 1 year or as needed.   4. Advised condom use 2 weeks after POP start and anytime she skips a pill. 5.  Pt refuses LARCs

## 2014-10-27 NOTE — Progress Notes (Signed)
Patient ID: [name] BoyersBeatriz Fjeld, female   DOB: 1986-06-03, 28 y.o.   MRN: [id number]  Spanish Interpreter: Nada Libmanlejandra Autry Pt is self-pay, pt desires birth control pills

## 2014-11-05 ENCOUNTER — Ambulatory Visit: Payer: Self-pay | Admitting: Obstetrics & Gynecology

## 2014-11-18 ENCOUNTER — Encounter: Payer: Self-pay | Admitting: Obstetrics & Gynecology

## 2015-04-28 NOTE — H&P (Signed)
L&D Evaluation:  History:  HPI 29 yo Hispanic female [id number] at 36.[redacted] weeks gestation presents with N/V and Diarrhea x 24 hours along with low grade fever. estimated date of confinement 09/23/2014   Patient's Medical History No Chronic Illness   Patient's Surgical History Ectopic pregnancy surgery; TAB   Medications Pre Natal Vitamins   Allergies NKDA   Social History none   Family History Non-Contributory   ROS:  ROS Per HPI   Exam:  Vital Signs stable  T 99.6   General no apparent distress   Mental Status clear   Heart normal sinus rhythm   Abdomen gravid, non-tender   Estimated Fetal Weight Average for gestational age   Back no CVAT   Edema no edema   Pelvic no external lesions, 1-2/70/-2/VTX/BOWI   FHT normal rate with no decels   Ucx irregular   Skin dry   Impression:  Impression 36.4 week Intrauterine pregnancy with AGE   Plan:  Plan UA, monitor contractions and for cervical change, fluids, Lomotil   Comments Discharge after rehydration. Labor precautions.   [facility]   Electronic Signatures: Martino Tompson, Prentice DockerMartin A (MD)  (Signed 13-Sep-15 02:12)  Authored: L&D Evaluation   Last Updated: 13-Sep-15 02:12 by Siomara Burkel, Prentice DockerMartin A (MD)

## 2015-10-27 IMAGING — US US OB COMP +14 WK
1 series · 12 of 28 positions shown · non-contrast
Comparison: none

[Series 1: us ob comp +14 wk · 12 of 91 slices shown]
[im 4/91]
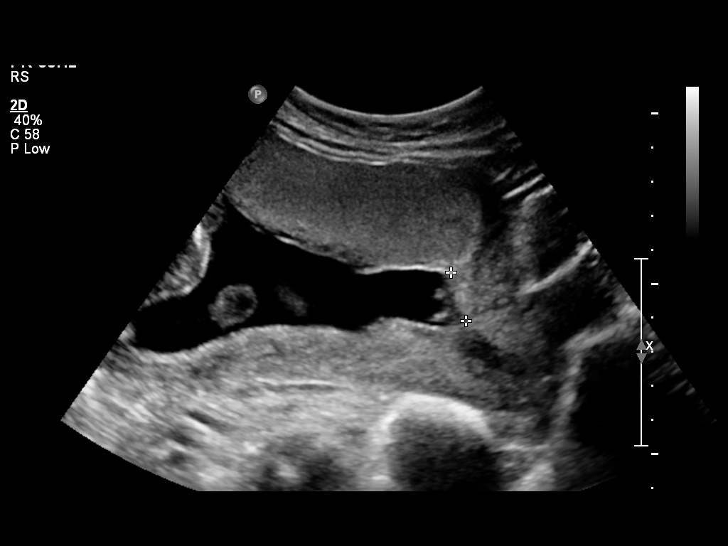
[im 11/91]
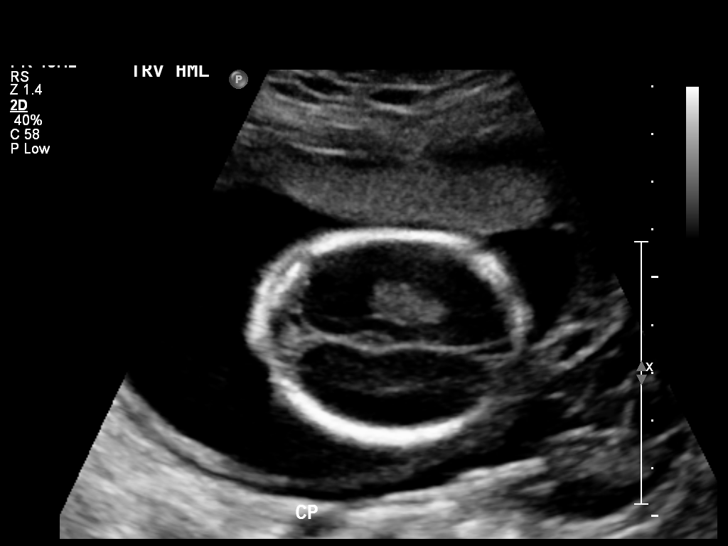
[im 17/91]
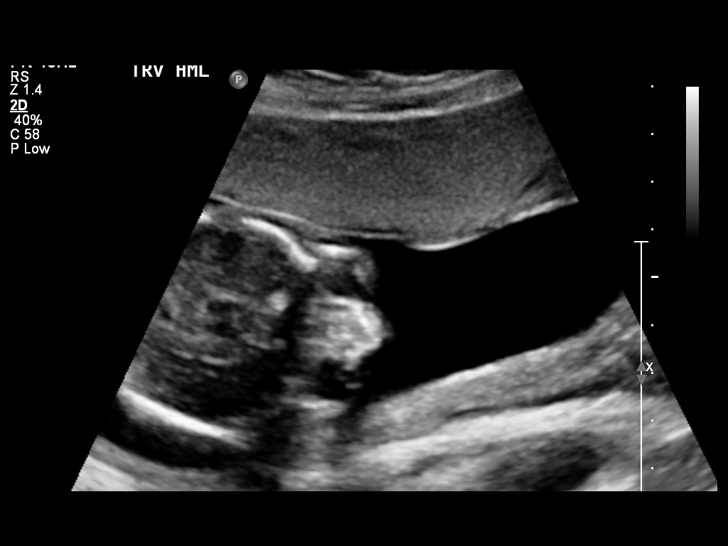
[im 27/91]
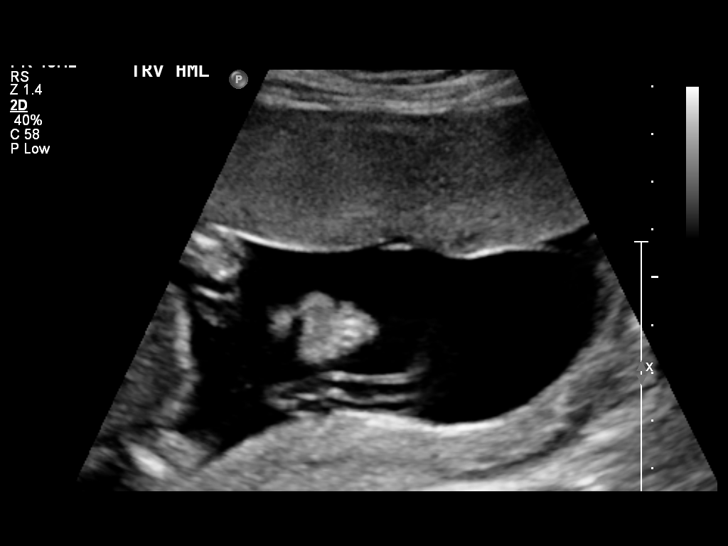
[im 34/91]
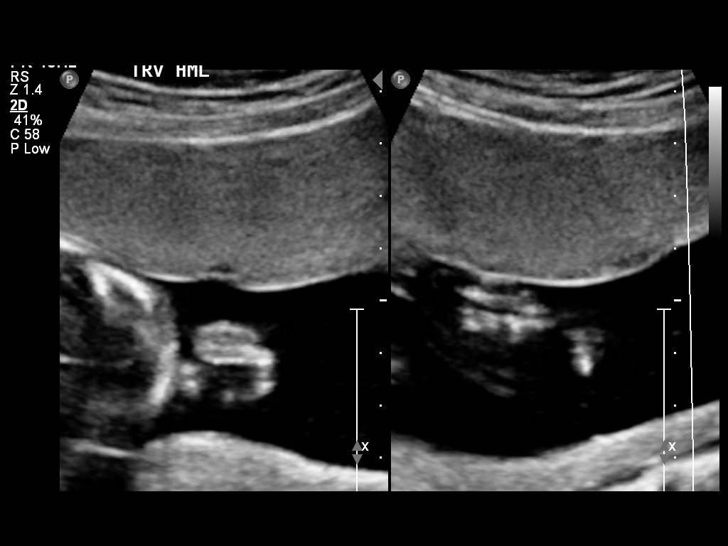
[im 41/91]
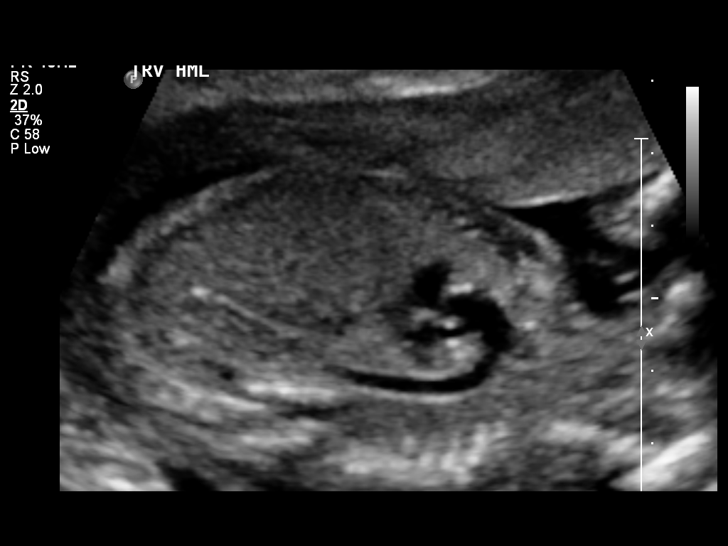
[im 51/91]
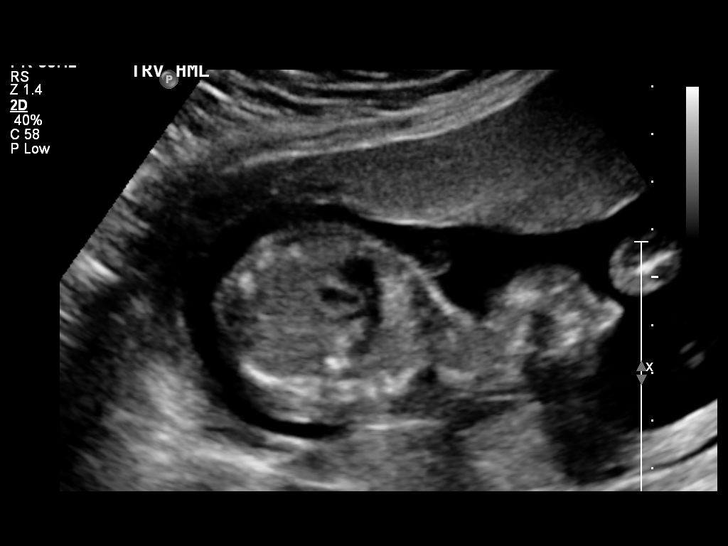
[im 57/91]
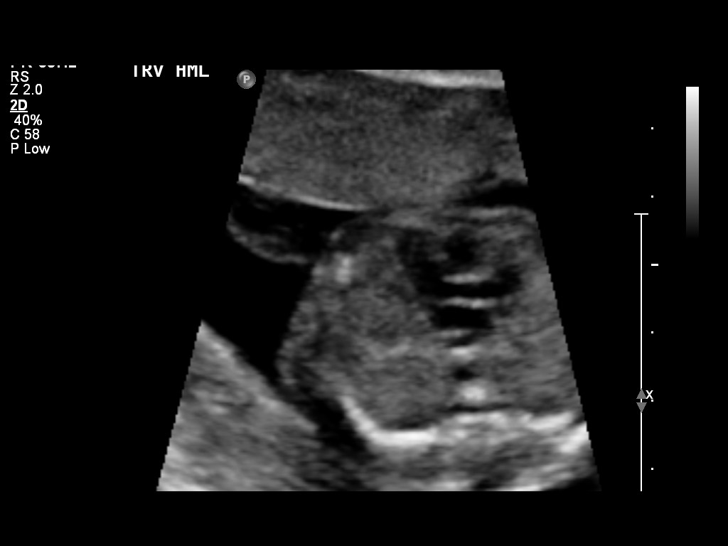
[im 64/91]
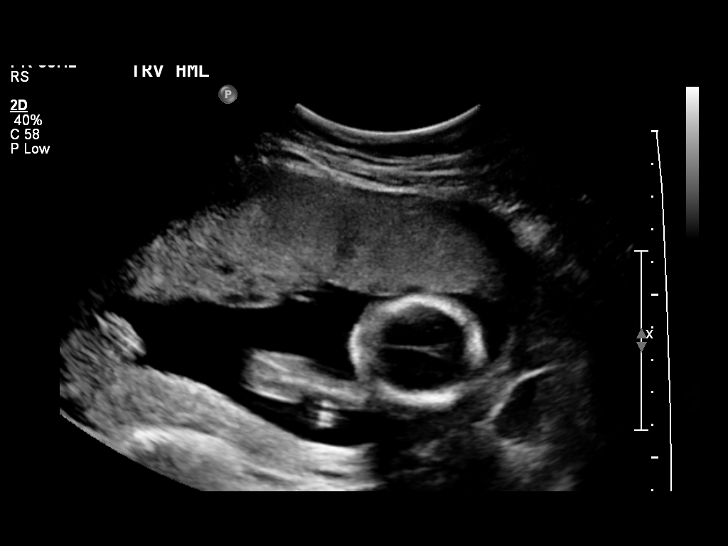
[im 74/91]
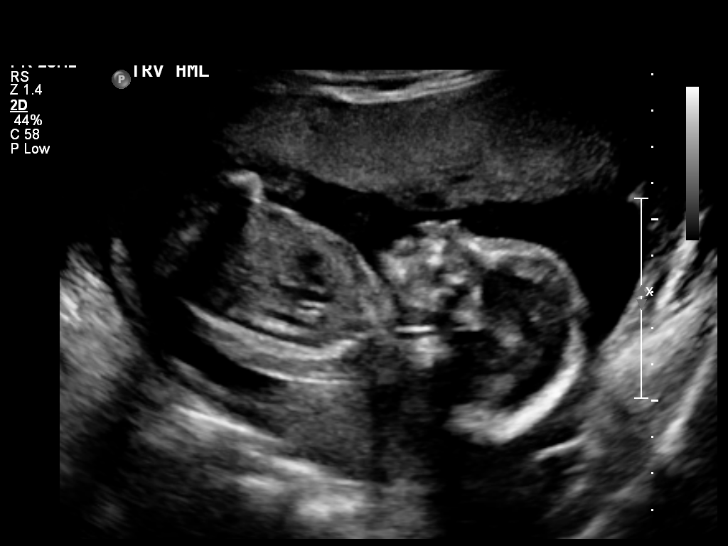
[im 81/91]
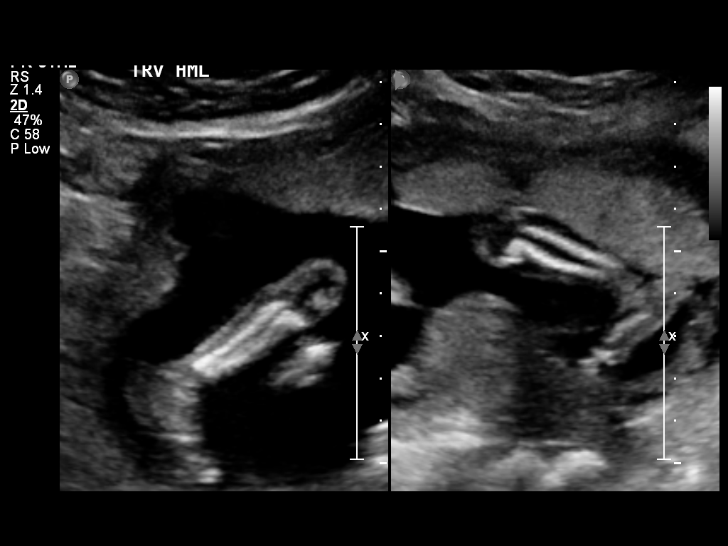
[im 87/91]
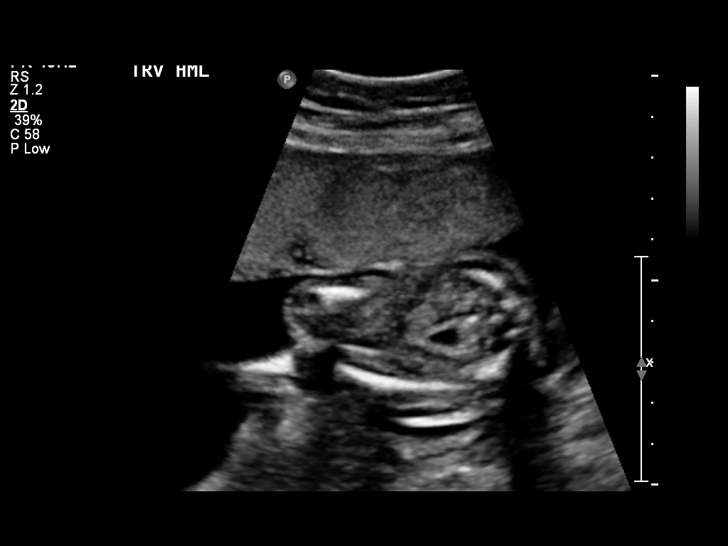

[12 of 28 positions shown; findings below may reference images not displayed]

OBSTETRICS REPORT
                      (Signed Final 05/05/2014 [DATE])

Service(s) Provided

 US OB COMP + 14 WK                                    76805.1
Indications

 Basic anatomic survey
 Previous ectopic pregnancy - laparoscopic removal
Fetal Evaluation

 Num Of Fetuses:    1
 Fetal Heart Rate:  155                          bpm
 Cardiac Activity:  Observed
 Presentation:      Transverse, head to
                    maternal left
 Placenta:          Anterior, above cervical os
 P. Cord            Visualized, central
 Insertion:

 Comment:    Synechiae noted in left fundus.

 Amniotic Fluid
 AFI FV:      Subjectively within normal limits
                                             Larg Pckt:    4.53  cm
Biometry

 BPD:     44.8  mm     G. Age:  19w 4d                CI:        66.63   70 - 86
                                                      FL/HC:      17.9   16.8 -

 HC:     175.9  mm     G. Age:  20w 1d       53  %    HC/AC:      1.22   1.09 -

 AC:     143.7  mm     G. Age:  19w 5d       40  %    FL/BPD:
 FL:      31.5  mm     G. Age:  19w 6d       40  %    FL/AC:      21.9   20 - 24
 HUM:     29.7  mm     G. Age:  19w 5d       50  %
 CER:     19.6  mm     G. Age:  18w 6d       24  %
 NFT:      4.3  mm

 Est. FW:     312  gm    0 lb 11 oz      47  %
Gestational Age

 U/S Today:     19w 6d                                        EDD:   09/23/14
 Best:          19w 6d     Det. By:  Early Ultrasound         EDD:   09/23/14
                                     (02/03/14)
Anatomy

 Cranium:          Appears normal         Aortic Arch:      Appears normal
 Fetal Cavum:      Appears normal         Ductal Arch:      Appears normal
 Ventricles:       Appears normal         Diaphragm:        Appears normal
 Choroid Plexus:   Appears normal         Stomach:          Appears normal
 Cerebellum:       Appears normal         Abdomen:          Appears normal
 Posterior Fossa:  Appears normal         Abdominal Wall:   Appears nml (cord
                                                            insert, abd wall)
 Nuchal Fold:      Appears normal         Cord Vessels:     Appears normal (3
                                                            vessel cord)
 Face:             Appears normal         Kidneys:          Appear normal
                   (orbits and profile)
 Lips:             Appears normal         Bladder:          Appears normal
 Heart:            Not well visualized    Spine:            Appears normal
 RVOT:             Appears normal         Lower             Appears normal
                                          Extremities:
 LVOT:             Appears normal         Upper             Appears normal
                                          Extremities:

 Other:  Fetus appears to be a male. Heels and 5th digit visualized. Nasal
         bone visualized. Technically difficult due to fetal position.
Targeted Anatomy

 Fetal Central Nervous System
 Lat. Ventricles:  7.4                    Cisterna Magna:
Cervix Uterus Adnexa

 Cervical Length:    3.4      cm

 Cervix:       Normal appearance by transabdominal scan.
 Uterus:       No abnormality visualized.
 Cul De Sac:   No free fluid seen.
 Left Ovary:    Within normal limits.
 Right Ovary:   Within normal limits.
 Adnexa:     No abnormality visualized.
Impression

 SIUP at 19+6 weeks
 Normal detailed fetal anatomy; limited views of 4-chambers
 but grossly normal
 Markers of aneuploidy: none
 Normal amniotic fluid volume
 Measurements consistent with prior US
Recommendations

 Follow-up as clinically indicated

 questions or concerns.

## 2016-01-21 IMAGING — US US OB FOLLOW-UP
1 series · 12 of 28 positions shown · non-contrast
Comparison: none

[Series 1: us ob follow-up · 0.15mm/px · 12 of 34 slices shown]
[im 2/34]
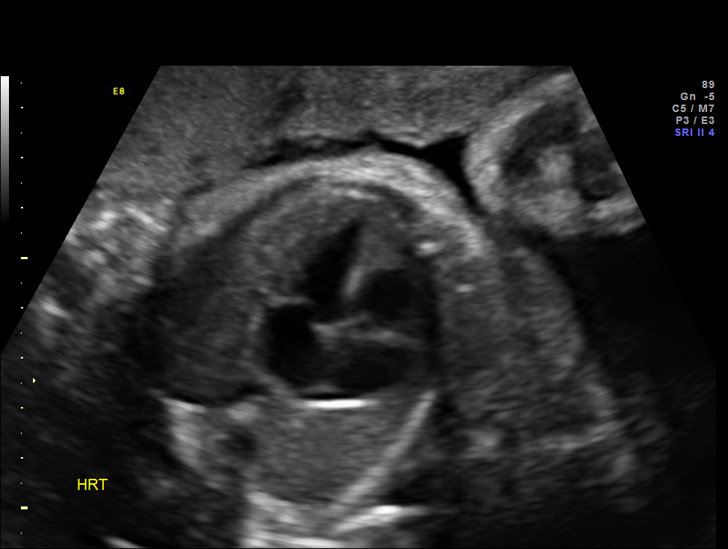
[im 4/34]
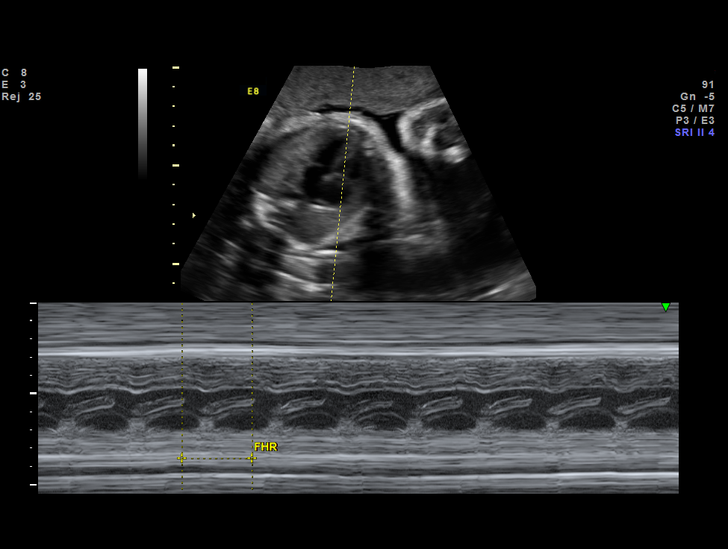
[im 7/34]
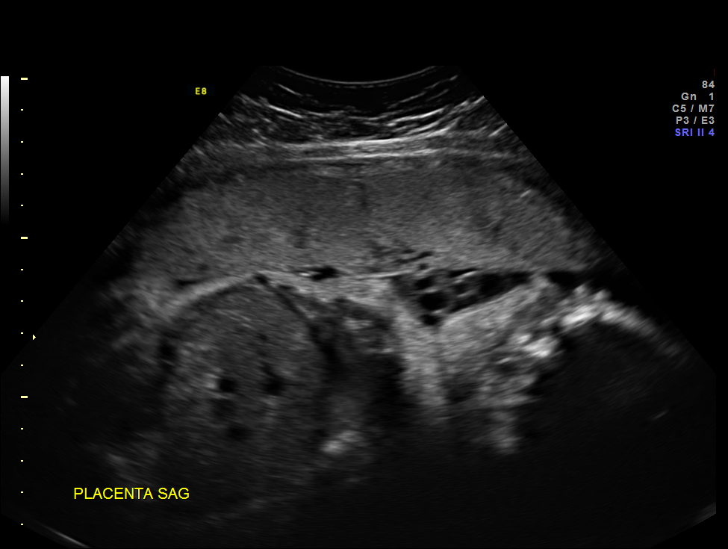
[im 10/34]
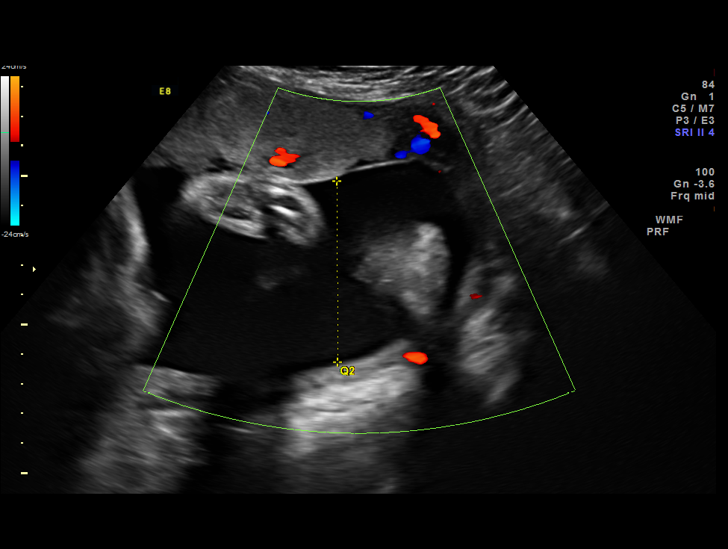
[im 13/34]
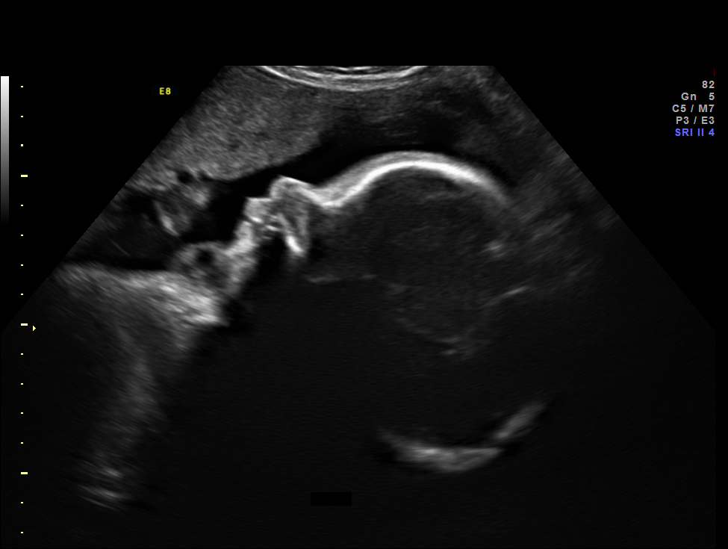
[im 15/34]
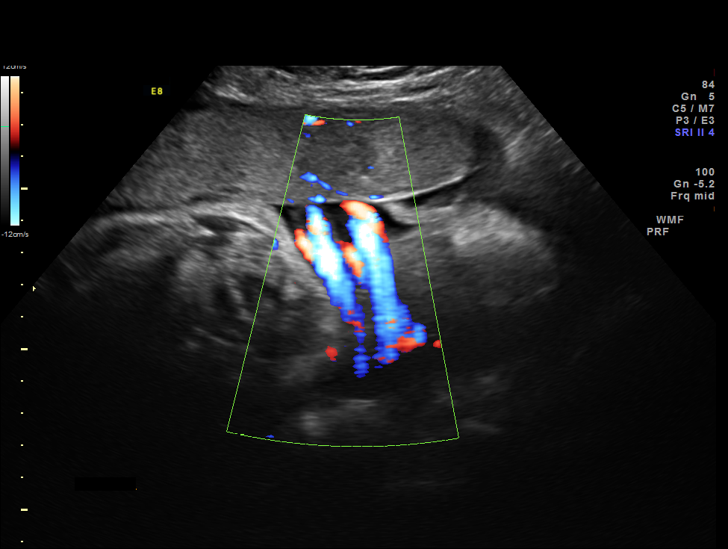
[im 19/34]
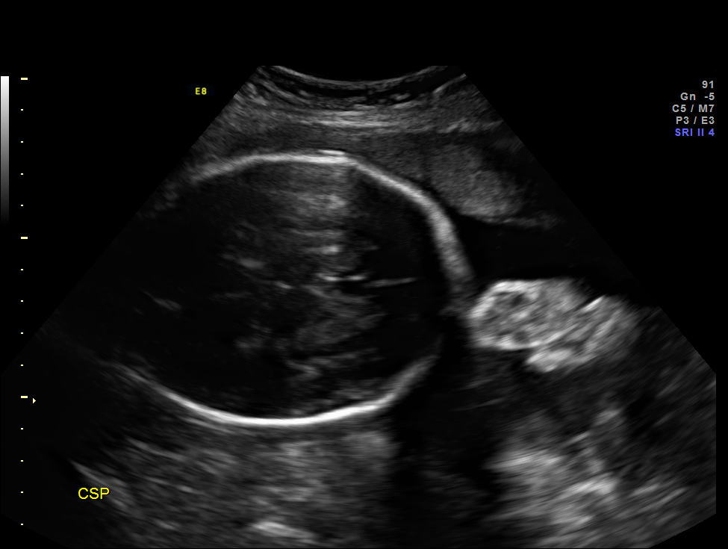
[im 21/34]
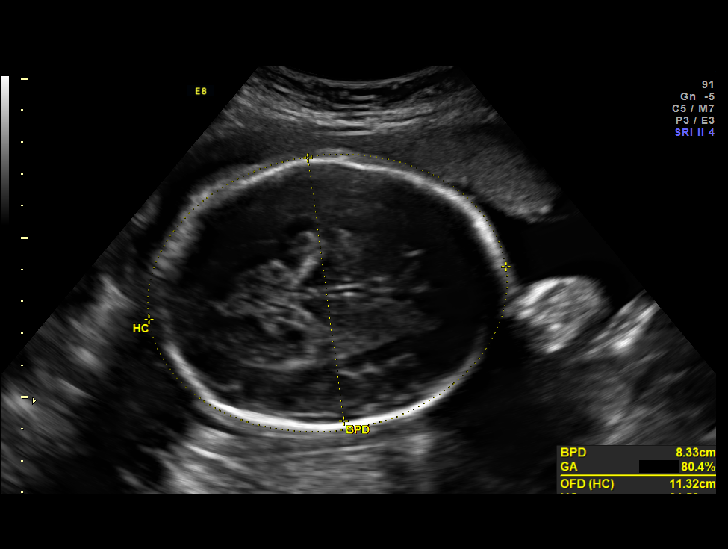
[im 24/34]
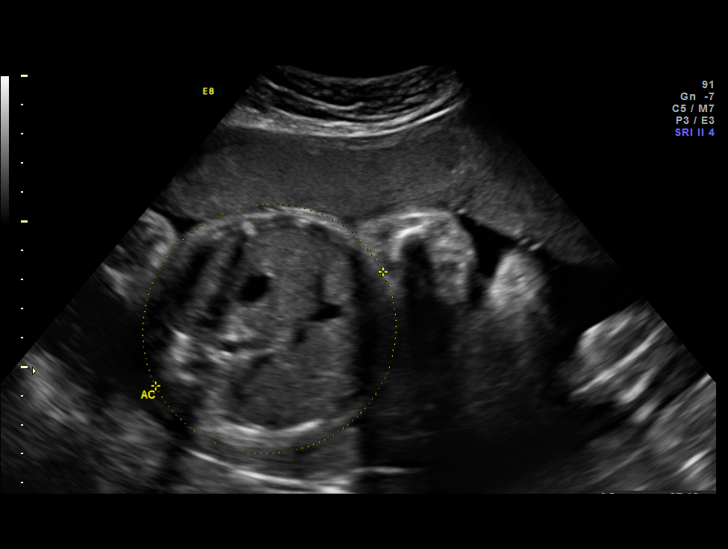
[im 27/34]
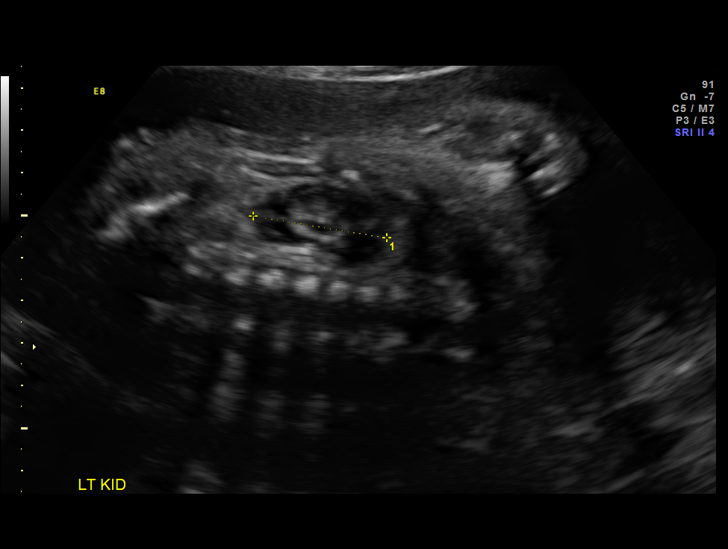
[im 30/34]
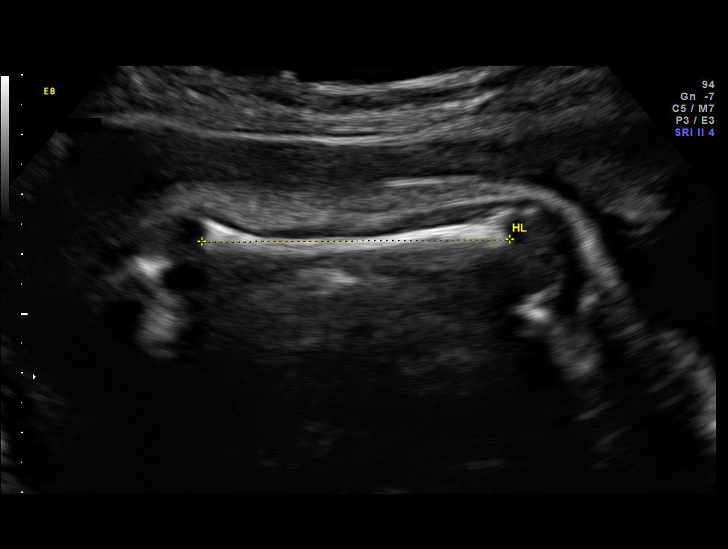
[im 32/34]
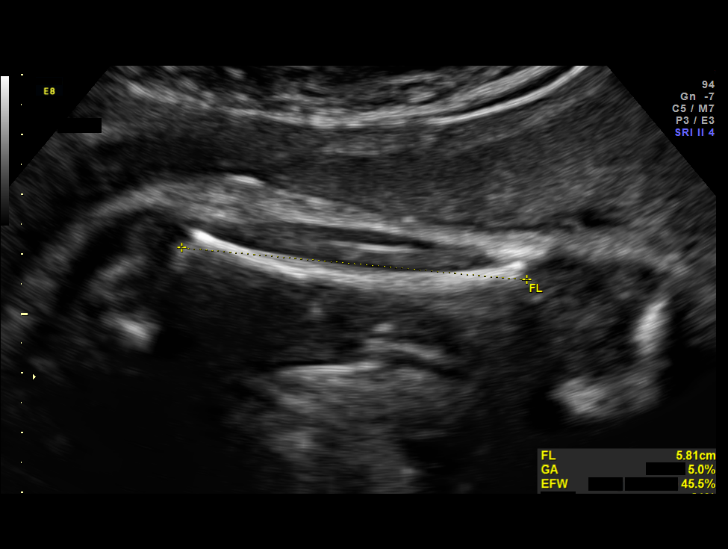

[12 of 28 positions shown; findings below may reference images not displayed]

OBSTETRICS REPORT
                      (Signed Final 07/30/2014 [DATE])

Service(s) Provided

 US OB FOLLOW UP                                       76816.1
Indications

 Abnormal biochemical screen (quad) for Trisomy
 21 ([DATE])
 Elevated Inhibin
Fetal Evaluation

 Num Of Fetuses:    1
 Fetal Heart Rate:  144                          bpm
 Cardiac Activity:  Observed
 Presentation:      Cephalic
 Placenta:          Anterior, above cervical os
 P. Cord            Previously Visualized
 Insertion:

 Amniotic Fluid
 AFI FV:      Subjectively within normal limits
 AFI Sum:     21.99   cm       85  %Tile     Larg Pckt:    6.21  cm
 RUQ:   5.85    cm   RLQ:    6.09   cm    LUQ:   6.21    cm   LLQ:    3.84   cm
Biometry

 BPD:     83.9  mm     G. Age:  33w 5d                CI:        71.91   70 - 86
                                                      FL/HC:      18.7   19.1 -

 HC:     314.9  mm     G. Age:  35w 2d       90  %    HC/AC:      1.16   0.96 -

 AC:     271.7  mm     G. Age:  31w 2d       24  %    FL/BPD:     70.1   71 - 87
 FL:      58.8  mm     G. Age:  30w 5d        9  %    FL/AC:      21.6   20 - 24
 HUM:     51.6  mm     G. Age:  30w 1d       10  %
 CER:     40.3  mm     G. Age:  34w 2d       78  %

 Est. FW:    6869  gm           4 lb     48  %
Gestational Age

 U/S Today:     32w 5d                                        EDD:   09/19/14
 Best:          32w 1d     Det. By:  Early Ultrasound         EDD:   09/23/14
                                     (02/03/14)
Anatomy
 Cranium:          Appears normal         Aortic Arch:      Previously seen
 Fetal Cavum:      Appears normal         Ductal Arch:      Previously seen
 Ventricles:       Appears normal         Diaphragm:        Previously seen
 Choroid Plexus:   Previously seen        Stomach:          Appears normal
 Cerebellum:       Appears normal         Abdomen:          Appears normal
 Posterior Fossa:  Appears normal         Abdominal Wall:   Previously seen
 Nuchal Fold:      Previously seen        Cord Vessels:     Previously seen
 Face:             Orbits and profile     Kidneys:          Appear normal
                   previously seen
 Lips:             Previously seen        Bladder:          Appears normal
 Heart:            Appears normal         Spine:            Previously seen
                   (4CH, axis, and
                   situs)
 RVOT:             Previously seen        Lower             Previously seen
                                          Extremities:
 LVOT:             Previously seen        Upper             Previously seen
                                          Extremities:

 Other:  Fetus appears to be a male. Heels and 5th digit previously visualized.
         Nasal bone previously visualized.
Cervix Uterus Adnexa

 Cervix:       Not visualized (advanced GA >53wks)

 Adnexa:     No abnormality visualized.
Impression

 Single IUP at 32w 1d
 Follow up due to elevated inhibin on Quad screen (2.39 MoM)
 Interval growth is appropriate (48th %tile)
 Anterior placenta without previa
 Normal amniotic fluid volume
Recommendations

 Follow-up ultrasounds as clinically indicated.

 questions or concerns.

## 2022-05-28 ENCOUNTER — Inpatient Hospital Stay (HOSPITAL_COMMUNITY)
Admission: AD | Admit: 2022-05-28 | Discharge: 2022-05-28 | Disposition: A | Discharge status: Home / Self Care | Payer: Self-pay | Attending: Obstetrics and Gynecology | Admitting: Obstetrics and Gynecology

## 2022-05-28 ENCOUNTER — Encounter (HOSPITAL_COMMUNITY): Payer: Self-pay | Admitting: *Deleted

## 2022-05-28 ENCOUNTER — Inpatient Hospital Stay (HOSPITAL_COMMUNITY): Payer: Self-pay

## 2022-05-28 DIAGNOSIS — N930 Postcoital and contact bleeding: Secondary | ICD-10-CM | POA: Insufficient documentation

## 2022-05-28 DIAGNOSIS — Z3491 Encounter for supervision of normal pregnancy, unspecified, first trimester: Secondary | ICD-10-CM

## 2022-05-28 DIAGNOSIS — O26891 Other specified pregnancy related conditions, first trimester: Secondary | ICD-10-CM | POA: Insufficient documentation

## 2022-05-28 DIAGNOSIS — Z3A01 Less than 8 weeks gestation of pregnancy: Secondary | ICD-10-CM | POA: Insufficient documentation

## 2022-05-28 LAB — CBC
HCT: 39.4 % (ref 36.0–46.0)
Hemoglobin: 13.1 g/dL (ref 12.0–15.0)
MCH: 32.9 pg (ref 26.0–34.0)
MCHC: 33.2 g/dL (ref 30.0–36.0)
MCV: 99 fL (ref 80.0–100.0)
Platelets: 224 10*3/uL (ref 150–400)
RBC: 3.98 MIL/uL (ref 3.87–5.11)
RDW: 12.6 % (ref 11.5–15.5)
WBC: 9.3 10*3/uL (ref 4.0–10.5)
nRBC: 0 % (ref 0.0–0.2)

## 2022-05-28 LAB — URINALYSIS, ROUTINE W REFLEX MICROSCOPIC
Bacteria, UA: NONE SEEN
Bilirubin Urine: NEGATIVE
Glucose, UA: NEGATIVE mg/dL
Ketones, ur: NEGATIVE mg/dL
Leukocytes,Ua: NEGATIVE
Nitrite: NEGATIVE
Protein, ur: NEGATIVE mg/dL
Specific Gravity, Urine: 1.003 — ABNORMAL LOW (ref 1.005–1.030)
pH: 7 (ref 5.0–8.0)

## 2022-05-28 LAB — POCT PREGNANCY, URINE: Preg Test, Ur: POSITIVE — AB

## 2022-05-28 LAB — HCG, QUANTITATIVE, PREGNANCY: hCG, Beta Chain, Quant, S: 45885 m[IU]/mL — ABNORMAL HIGH (ref <5)

## 2022-05-28 MED ORDER — PREPLUS 27-1 MG PO TABS: 1.0000 | ORAL_TABLET | Freq: Every day | ORAL | 13 refills | Status: DC

## 2022-05-28 NOTE — Discharge Instructions (Signed)
Return to care  If you have heavier bleeding that soaks through more than 2 pads per hour for an hour or more If you bleed so much that you feel like you might pass out or you do pass out If you have significant abdominal pain that is not improved with Tylenol      [facility] Providers          [facility]  [address], [place], [place] [address]  [phone]  [facility]  [address], [place], [place] [address]  [phone]  [facility]  [address], , [place] [address]  [phone]  [facility] [place]  [address], [place], [place] [address]  [phone]  [facility]  [address] floor), [place], [place] [address]  [phone]  [facility]  [address], [place], [place] [address]  [phone]  [facility]  [address], [place], [place] [address]  [phone]  [facility]  [address], [place], [place] [address]  [phone]  [facility]  [address], [place], [place] [address]  [phone]  [facility]  [address], [place], [place] [address]  [phone]  [facility]  [address], [place], [place] [address]  [phone]  [facility]   [address], [place], [place] [address]  [phone]  [facility]  [address], [place], [place] [address]   [phone]  [facility]  [address], [place],  [address]  [phone]         

## 2022-05-28 NOTE — MAU Provider Note (Signed)
History     [id number]  Arrival date and time: 05/28/22 0100    Chief Complaint  Patient presents with   Vaginal Bleeding     HPI [name] [name] is a 36 y.o. at [redacted]w[redacted]d who presents for vaginal bleeding. Bleeding started after intercourse this evening. Reports pink blood on toilet paper & in toilet. Bleeding has not continued. Denies abdominal pain, dysuria, vaginal discharge, or vaginal irritation.  Had ultrasound in [place] last week & was told that she was [redacted] weeks pregnant & the pregnancy looked normal.    OB History     Gravida  7   Para  4   Term  4   Preterm      AB  2   Living  4      SAB  0   IAB  1   Ectopic  1   Multiple      Live Births  4           Past Medical History:  Diagnosis Date   Vaginal Pap smear, abnormal     Past Surgical History:  Procedure Laterality Date   ECTOPIC PREGNANCY SURGERY      Family History  Problem Relation Age of Onset   Alcohol abuse Neg Hx    Arthritis Neg Hx    Asthma Neg Hx    Birth defects Neg Hx    Cancer Neg Hx    COPD Neg Hx    Depression Neg Hx    Diabetes Neg Hx    Drug abuse Neg Hx    Early death Neg Hx    Hearing loss Neg Hx    Heart disease Neg Hx    Hyperlipidemia Neg Hx    Hypertension Neg Hx    Kidney disease Neg Hx    Learning disabilities Neg Hx    Mental illness Neg Hx    Mental retardation Neg Hx    Stroke Neg Hx    Vision loss Neg Hx    Miscarriages / Stillbirths Neg Hx    Varicose Veins Neg Hx     No Known Allergies  No current facility-administered medications on file prior to encounter.   Current Outpatient Medications on File Prior to Encounter  Medication Sig Dispense Refill   docusate sodium (COLACE) 100 MG capsule Take 1 capsule (100 mg total) by mouth 2 (two) times daily. 30 capsule 0   ibuprofen (ADVIL,MOTRIN) 600 MG tablet Take 1 tablet (600 mg total) by mouth every 6 (six) hours. 30 tablet 0   norethindrone (ORTHO MICRONOR) 0.35 MG tablet Take 1 tablet (0.35 mg  total) by mouth daily. 1 Package 11   oxyCODONE-acetaminophen (PERCOCET/ROXICET) 5-325 MG per tablet Take 1 tablet by mouth every 4 (four) hours as needed for moderate pain or severe pain (1 tab pain score <7, 2 tabs pain score >7). 10 tablet 0     ROS Pertinent positives and negative per HPI, all others reviewed and negative  Physical Exam   BP 104/66 (BP Location: Right Arm)   Pulse 78   Temp 98.5 F (36.9 C)   Resp 17   Ht 5' (1.524 m)   Wt 58.5 kg   LMP 04/11/2022   SpO2 100%   BMI 25.19 kg/m   Patient Vitals for the past 24 hrs:  BP Temp Pulse Resp SpO2 Height Weight  05/28/22 0120 104/66 98.5 F (36.9 C) 78 17 100 % 5' (1.524 m) 58.5 kg    Physical Exam Vitals and  nursing note reviewed. Exam conducted with a chaperone present.  Constitutional:      General: She is not in acute distress.    Appearance: Normal appearance.  HENT:     Head: Normocephalic and atraumatic.  Eyes:     General: No scleral icterus.    Conjunctiva/sclera: Conjunctivae normal.  Pulmonary:     Effort: Pulmonary effort is normal. No respiratory distress.  Abdominal:     General: Abdomen is flat.     Palpations: Abdomen is soft.     Tenderness: There is no abdominal tenderness. There is no guarding or rebound.  Genitourinary:    Comments: No blood Skin:    General: Skin is warm and dry.  Neurological:     Mental Status: She is alert.  Psychiatric:        Mood and Affect: Mood normal.        Behavior: Behavior normal.        Labs Results for orders placed or performed during the hospital encounter of 05/28/22 (from the past 24 hour(s))  Urinalysis, Routine w reflex microscopic Urine, Clean Catch     Status: Abnormal   Collection Time: 05/28/22  1:15 AM  Result Value Ref Range   Color, Urine COLORLESS (A) YELLOW   APPearance CLEAR CLEAR   Specific Gravity, Urine 1.003 (L) 1.005 - 1.030   pH 7.0 5.0 - 8.0   Glucose, UA NEGATIVE NEGATIVE mg/dL   Hgb urine dipstick SMALL (A)  NEGATIVE   Bilirubin Urine NEGATIVE NEGATIVE   Ketones, ur NEGATIVE NEGATIVE mg/dL   Protein, ur NEGATIVE NEGATIVE mg/dL   Nitrite NEGATIVE NEGATIVE   Leukocytes,Ua NEGATIVE NEGATIVE   RBC / HPF 0-5 0 - 5 RBC/hpf   WBC, UA 0-5 0 - 5 WBC/hpf   Bacteria, UA NONE SEEN NONE SEEN  Pregnancy, urine POC     Status: Abnormal   Collection Time: 05/28/22  1:32 AM  Result Value Ref Range   Preg Test, Ur POSITIVE (A) NEGATIVE  CBC     Status: None   Collection Time: 05/28/22  2:29 AM  Result Value Ref Range   WBC 9.3 4.0 - 10.5 K/uL   RBC 3.98 3.87 - 5.11 MIL/uL   Hemoglobin 13.1 12.0 - 15.0 g/dL   HCT 03.2 12.2 - 48.2 %   MCV 99.0 80.0 - 100.0 fL   MCH 32.9 26.0 - 34.0 pg   MCHC 33.2 30.0 - 36.0 g/dL   RDW 50.0 37.0 - 48.8 %   Platelets 224 150 - 400 K/uL   nRBC 0.0 0.0 - 0.2 %  hCG, quantitative, pregnancy     Status: Abnormal   Collection Time: 05/28/22  2:29 AM  Result Value Ref Range   hCG, Beta Chain, Quant, S 45,885 (H) <5 mIU/mL    Imaging US OB LESS THAN 14 WEEKS WITH OB TRANSVAGINAL  Result Date: 05/28/2022 CLINICAL DATA:  Vaginal bleeding EXAM: OBSTETRIC <14 WK [place] AND TRANSVAGINAL OB US TECHNIQUE: Both transabdominal and transvaginal ultrasound examinations were performed for complete evaluation of the gestation as well as the maternal uterus, adnexal regions, and pelvic cul-de-sac. Transvaginal technique was performed to assess early pregnancy. COMPARISON:  None Available. FINDINGS: Intrauterine gestational sac: Single Yolk sac:  Visualized. Embryo:  Visualized. Cardiac Activity: Visualized. Heart Rate: 129 bpm MSD:   mm    w     d CRL:  5.4 mm   6 w   2 d  US EDC: 01/19/2023 Subchorionic hemorrhage:  None visualized. Maternal uterus/adnexae: No adnexal mass or free fluid. IMPRESSION: Five week 4 day intrauterine pregnancy. Fetal heart rate 129 beats per minute. No acute maternal findings. Electronically Signed   By: Charlett NoseKevin  Dover M.D.   On: 05/28/2022 03:24    MAU  Course  Procedures Lab Orders         Urinalysis, Routine w reflex microscopic Urine, Clean Catch         CBC         hCG, quantitative, pregnancy         Pregnancy, urine POC     Meds ordered this encounter  Medications   Prenatal Vit-Fe Fumarate-FA (PREPLUS) 27-1 MG TABS    Sig: Take 1 tablet by mouth daily.    Dispense:  30 tablet    Refill:  13    Order Specific Question:   Supervising Provider    Answer:   Alysia PennaERVIN, MICHAEL L [[id number]]   Imaging Orders         US OB LESS THAN 14 WEEKS WITH OB TRANSVAGINAL     MDM Currently no bleeding. RH positive.  Ultrasound shows live IUP measuring 5636w2d (c/w LMP) Spotting likely related to recent intercourse. Stable for discharge home. Requesting rx for prenatal vitamins.  Assessment and Plan   1. Postcoital bleeding   2. Normal IUP (intrauterine pregnancy) on prenatal ultrasound, first trimester   3. [redacted] weeks gestation of pregnancy    -Reviewed bleeding precautions & reasons to return to MAU -Rx PNV -Given list of ob/gyns, start prenatal care  Judeth HornErin Jordann Grime, NP 05/28/22 3:53 AM

## 2022-05-28 NOTE — MAU Note (Signed)
.  [name] [name] is a 36 y.o. at Unknown here in MAU reporting vaginal bleeding for an hour. Had one pain that felt like a ctx earlier but no pain now. LMP 04/11/22 and pt states she is pregnant.  LMP: 04/11/22 Onset of complaint: one hour ago Pain score: 0 Vitals:   05/28/22 0120  BP: 104/66  Pulse: 78  Resp: 17  Temp: 98.5 F (36.9 C)  SpO2: 100%     FHT:n/a Lab orders placed from triage:  upt

## 2022-12-19 NOTE — L&D Delivery Note (Signed)
Delivery Note:   [name] NEUROTH [id number] at [redacted]w[redacted]d  Admitting diagnosis: Post term pregnancy over 40 weeks [O48.0] Risks:grand multiparous  First Stage:  Induction of labor:yes, elective Augmentation: AROM and Pitocin ROM: 9983 Active labor onset: 1545 Analgesia /Anesthesia/Pain control intrapartum: Epidural   Second Stage:  Complete dilation at 01/19/2023  2045 Onset of pushing at 2050 FHR second stage 10 minuter   Pushing in lithotomy position with CNM and L&D staff support, 2 teenage daughters present for birth and supportive. Nuchal Cord: No  Delivery of a Live born female  Birth Weight:  8# 3 oz APGAR: 9, 9  Newborn Delivery   Birth date/time: 01/19/2023 21:02:00 Delivery type: Vaginal, Spontaneous     APGAR: 10 min-  9  Infant delivered in cephalic presentation, in LOA position and restituted to LOT position.  Cord double clamped after 1 minute, cut by daughter.  Collection of cord blood for typing completed. Cord blood donation-None  Arterial cord blood sample-No    Third Stage:  Placenta delivered-Spontaneous  with 3 vessels . Uterine tone firm bleeding moderate 20u Pitocin in 500cc LR given as a bolus prior delivery of placenta Uterotonics: TXA at beginning of 2nd stage; cytotec 852mcg PR for increased bleeding.  No cx lacerations identified Placenta to L&D.  None  laceration identified.  Episiotomy:None  Local analgesia: none  Repair:n/a Est. Blood Loss (JA):250.53   Complications: None   Mom to postpartum.  Baby girl to Couplet care / Skin to Skin.  Delivery Report:   Review the Delivery Report for details.     Signed: Christin Fudge, DNP,CNM 01/19/2023, 10:58 PM

## 2023-01-04 ENCOUNTER — Telehealth: Payer: Self-pay

## 2023-01-04 NOTE — Telephone Encounter (Signed)
Attempted to reach pt via interpreter to notify of appt on 01/09/23 at [facility]. Pt vm not set up

## 2023-01-09 ENCOUNTER — Other Ambulatory Visit: Payer: Self-pay

## 2023-01-09 ENCOUNTER — Ambulatory Visit (INDEPENDENT_AMBULATORY_CARE_PROVIDER_SITE_OTHER): Payer: Self-pay | Admitting: Obstetrics and Gynecology

## 2023-01-09 ENCOUNTER — Encounter: Payer: Self-pay | Admitting: Obstetrics and Gynecology

## 2023-01-09 ENCOUNTER — Other Ambulatory Visit (HOSPITAL_COMMUNITY)
Admission: RE | Admit: 2023-01-09 | Discharge: 2023-01-09 | Disposition: A | Discharge status: Home / Self Care | Payer: Self-pay | Source: Ambulatory Visit | Attending: Obstetrics and Gynecology | Admitting: Obstetrics and Gynecology

## 2023-01-09 VITALS — BP 113/76 | HR 100 | Wt 164.0 lb

## 2023-01-09 DIAGNOSIS — O0990 Supervision of high risk pregnancy, unspecified, unspecified trimester: Secondary | ICD-10-CM | POA: Insufficient documentation

## 2023-01-09 DIAGNOSIS — O09523 Supervision of elderly multigravida, third trimester: Secondary | ICD-10-CM | POA: Insufficient documentation

## 2023-01-09 DIAGNOSIS — O0993 Supervision of high risk pregnancy, unspecified, third trimester: Secondary | ICD-10-CM

## 2023-01-09 DIAGNOSIS — Z3490 Encounter for supervision of normal pregnancy, unspecified, unspecified trimester: Secondary | ICD-10-CM

## 2023-01-09 DIAGNOSIS — Z3A39 39 weeks gestation of pregnancy: Secondary | ICD-10-CM

## 2023-01-09 NOTE — Progress Notes (Signed)
   PRENATAL VISIT NOTE  Subjective:  [name] [name] is a 37 y.o. [id number] at [redacted]w[redacted]d being seen today for ongoing prenatal care.  She is currently monitored for the following issues for this low-risk pregnancy and has Supervision of high risk pregnancy, antepartum and Advanced maternal age in multigravida, third trimester on their problem list.  Patient doing well with no acute concerns today. She reports occasional contractions.  Contractions: Irritability. Vag. Bleeding: None.  Movement: Present. Denies leaking of fluid.   Pt is new to our practice.  She did have an early ultrasound but then received the majority of her care in [place].  The following portions of the patient's history were reviewed and updated as appropriate: allergies, current medications, past family history, past medical history, past social history, past surgical history and problem list. Problem list updated.  Objective:   Vitals:   01/09/23 1559  BP: 113/76  Pulse: 100  Weight: 164 lb (74.4 kg)    Fetal Status: Fetal Heart Rate (bpm): 151 Fundal Height: 40 cm Movement: Present     General:  Alert, oriented and cooperative. Patient is in no acute distress.  Skin: Skin is warm and dry. No rash noted.   Cardiovascular: Normal heart rate noted  Respiratory: Normal respiratory effort, no problems with respiration noted  Abdomen: Soft, gravid, appropriate for gestational age.  Pain/Pressure: Present (pressure)     Pelvic: Cervical exam performed Dilation: 3.5 Effacement (%): 70 Station: -2  Extremities: Normal range of motion.  Edema: Trace  Mental Status:  Normal mood and affect. Normal behavior. Normal judgment and thought content.   Assessment and Plan:  Pregnancy: [id number] at [redacted]w[redacted]d  1. Supervision of high risk pregnancy, antepartum Induction of labor scheduled as postdates on 01/19/23  - Culture, beta strep (group b only) - Cervicovaginal ancillary only - CBC/D/Plt+RPR+Rh+ABO+RubIgG... - Culture, OB Urine -  Hemoglobin A1c  2. [redacted] weeks gestation of pregnancy   3. Advanced maternal age in multigravida, third trimester   4. Encounter for planned induction of labor  - CBC; Standing - Type and screen; Standing - RPR; Standing  Term labor symptoms and general obstetric precautions including but not limited to vaginal bleeding, contractions, leaking of fluid and fetal movement were reviewed in detail with the patient.  Please refer to After Visit Summary for other counseling recommendations.   Return in about 1 week (around 01/16/2023) for ROB, in person.   Lynnda Shields, MD Faculty Attending Center for [facility]

## 2023-01-10 LAB — CERVICOVAGINAL ANCILLARY ONLY
Bacterial Vaginitis (gardnerella): POSITIVE — AB
Candida Glabrata: NEGATIVE
Candida Vaginitis: POSITIVE — AB
Chlamydia: NEGATIVE
Comment: NEGATIVE
Comment: NEGATIVE
Comment: NEGATIVE
Comment: NEGATIVE
Comment: NEGATIVE
Comment: NORMAL
Neisseria Gonorrhea: NEGATIVE
Trichomonas: NEGATIVE

## 2023-01-11 ENCOUNTER — Telehealth: Payer: Self-pay | Admitting: *Deleted

## 2023-01-11 LAB — CULTURE, OB URINE

## 2023-01-11 LAB — URINE CULTURE, OB REFLEX: Organism ID, Bacteria: NO GROWTH

## 2023-01-11 MED ORDER — MONISTAT 3 4 % VA CREA: 1.0000 | TOPICAL_CREAM | Freq: Every day | VAGINAL | 0 refills | Status: AC

## 2023-01-11 MED ORDER — METRONIDAZOLE 500 MG PO TABS: 500.0000 mg | ORAL_TABLET | Freq: Two times a day (BID) | ORAL | 0 refills | Status: DC

## 2023-01-11 NOTE — Telephone Encounter (Addendum)
-----  Message from Griffin Basil, MD sent at 01/10/2023 11:30 PM EST ----- BV and yeast noted on swab, can offer treatment, pt is 39 weeks  1/24  1616  Called pt with [facility] - interpreter. She was informed of test results and medication which will be sent to her pharmacy. Dosage instructions given. She voiced understanding and had no questions.

## 2023-01-12 ENCOUNTER — Telehealth (HOSPITAL_COMMUNITY): Payer: Self-pay | Admitting: *Deleted

## 2023-01-12 NOTE — Telephone Encounter (Signed)
Interpreter number [phone] Preadmission screen

## 2023-01-13 ENCOUNTER — Other Ambulatory Visit (HOSPITAL_COMMUNITY): Payer: Self-pay | Admitting: Advanced Practice Midwife

## 2023-01-13 LAB — CBC/D/PLT+RPR+RH+ABO+RUBIGG...
Antibody Screen: NEGATIVE
Basophils Absolute: 0 10*3/uL (ref 0.0–0.2)
Basos: 0 %
EOS (ABSOLUTE): 0 10*3/uL (ref 0.0–0.4)
Eos: 0 %
HCV Ab: NONREACTIVE
HIV Screen 4th Generation wRfx: NONREACTIVE
Hematocrit: 35.2 % (ref 34.0–46.6)
Hemoglobin: 12.1 g/dL (ref 11.1–15.9)
Hepatitis B Surface Ag: NEGATIVE
Immature Grans (Abs): 0 10*3/uL (ref 0.0–0.1)
Immature Granulocytes: 1 %
Lymphocytes Absolute: 1.7 10*3/uL (ref 0.7–3.1)
Lymphs: 23 %
MCH: 32.1 pg (ref 26.6–33.0)
MCHC: 34.4 g/dL (ref 31.5–35.7)
MCV: 93 fL (ref 79–97)
Monocytes Absolute: 0.7 10*3/uL (ref 0.1–0.9)
Monocytes: 10 %
Neutrophils Absolute: 4.7 10*3/uL (ref 1.4–7.0)
Neutrophils: 66 %
Platelets: 173 10*3/uL (ref 150–450)
RBC: 3.77 x10E6/uL (ref 3.77–5.28)
RDW: 13 % (ref 11.7–15.4)
RPR Ser Ql: NONREACTIVE
Rh Factor: POSITIVE
Rubella Antibodies, IGG: 2.52 index (ref >0.99)
WBC: 7.1 10*3/uL (ref 3.4–10.8)

## 2023-01-13 LAB — HCV INTERPRETATION

## 2023-01-13 LAB — HEMOGLOBIN A1C
Est. average glucose Bld gHb Est-mCnc: 117 mg/dL
Hgb A1c MFr Bld: 5.7 % — ABNORMAL HIGH (ref 4.8–5.6)

## 2023-01-13 LAB — CULTURE, BETA STREP (GROUP B ONLY): Strep Gp B Culture: NEGATIVE

## 2023-01-16 ENCOUNTER — Other Ambulatory Visit: Payer: Self-pay

## 2023-01-16 ENCOUNTER — Ambulatory Visit (INDEPENDENT_AMBULATORY_CARE_PROVIDER_SITE_OTHER): Payer: Self-pay | Admitting: Obstetrics and Gynecology

## 2023-01-16 VITALS — BP 121/85 | HR 114 | Wt 166.5 lb

## 2023-01-16 DIAGNOSIS — Z3A40 40 weeks gestation of pregnancy: Secondary | ICD-10-CM

## 2023-01-16 DIAGNOSIS — O0990 Supervision of high risk pregnancy, unspecified, unspecified trimester: Secondary | ICD-10-CM

## 2023-01-16 DIAGNOSIS — O0993 Supervision of high risk pregnancy, unspecified, third trimester: Secondary | ICD-10-CM

## 2023-01-16 NOTE — Progress Notes (Signed)
   PRENATAL VISIT NOTE  Subjective:  [name] [name] is a 37 y.o. [id number] at [redacted]w[redacted]d being seen today for ongoing prenatal care.  She is currently monitored for the following issues for this low-risk pregnancy and has Supervision of high risk pregnancy, antepartum and Advanced maternal age in multigravida, third trimester on their problem list.  Patient doing well with no acute concerns today. She reports no complaints.  Contractions: Not present. Vag. Bleeding: None.  Movement: Present. Denies leaking of fluid.   The following portions of the patient's history were reviewed and updated as appropriate: allergies, current medications, past family history, past medical history, past social history, past surgical history and problem list. Problem list updated.  Objective:   Vitals:   01/16/23 1112  BP: 121/85  Pulse: (!) 114  Weight: 166 lb 8 oz (75.5 kg)    Fetal Status: Fetal Heart Rate (bpm): 156 Fundal Height: 39 cm Movement: Present     General:  Alert, oriented and cooperative. Patient is in no acute distress.  Skin: Skin is warm and dry. No rash noted.   Cardiovascular: Normal heart rate noted  Respiratory: Normal respiratory effort, no problems with respiration noted  Abdomen: Soft, gravid, appropriate for gestational age.  Pain/Pressure: Present     Pelvic: Cervical exam deferred        Extremities: Normal range of motion.  Edema: Trace  Mental Status:  Normal mood and affect. Normal behavior. Normal judgment and thought content.   Assessment and Plan:  Pregnancy: [id number] at [redacted]w[redacted]d  1. Supervision of high risk pregnancy, antepartum Pt scheduled for IOL on 01/19/23  2. [redacted] weeks gestation of pregnancy   Term labor symptoms and general obstetric precautions including but not limited to vaginal bleeding, contractions, leaking of fluid and fetal movement were reviewed in detail with the patient.  Please refer to After Visit Summary for other counseling recommendations.   No  follow-ups on file.   Lynnda Shields, MD Faculty Attending Center for [facility]

## 2023-01-17 ENCOUNTER — Telehealth (HOSPITAL_COMMUNITY): Payer: Self-pay | Admitting: *Deleted

## 2023-01-17 ENCOUNTER — Encounter (HOSPITAL_COMMUNITY): Payer: Self-pay | Admitting: *Deleted

## 2023-01-17 NOTE — Telephone Encounter (Signed)
Preadmission screen INTERPRETER NUMBER [phone]

## 2023-01-18 ENCOUNTER — Other Ambulatory Visit: Payer: Self-pay | Admitting: Advanced Practice Midwife

## 2023-01-19 ENCOUNTER — Encounter (HOSPITAL_COMMUNITY): Payer: Self-pay | Admitting: Obstetrics and Gynecology

## 2023-01-19 ENCOUNTER — Inpatient Hospital Stay (HOSPITAL_COMMUNITY): Payer: Medicaid Other

## 2023-01-19 ENCOUNTER — Inpatient Hospital Stay (HOSPITAL_COMMUNITY)
Admission: AD | Admit: 2023-01-19 | Discharge: 2023-01-21 | DRG: 807 | Disposition: A | Discharge status: Home / Self Care | Payer: Medicaid Other | Attending: Obstetrics and Gynecology | Admitting: Obstetrics and Gynecology

## 2023-01-19 ENCOUNTER — Inpatient Hospital Stay (HOSPITAL_COMMUNITY): Payer: Medicaid Other | Admitting: Anesthesiology

## 2023-01-19 ENCOUNTER — Other Ambulatory Visit: Payer: Self-pay

## 2023-01-19 DIAGNOSIS — Z87891 Personal history of nicotine dependence: Secondary | ICD-10-CM

## 2023-01-19 DIAGNOSIS — O09523 Supervision of elderly multigravida, third trimester: Secondary | ICD-10-CM | POA: Diagnosis present

## 2023-01-19 DIAGNOSIS — Z3490 Encounter for supervision of normal pregnancy, unspecified, unspecified trimester: Secondary | ICD-10-CM

## 2023-01-19 DIAGNOSIS — O480 Post-term pregnancy: Principal | ICD-10-CM | POA: Diagnosis present

## 2023-01-19 DIAGNOSIS — O0990 Supervision of high risk pregnancy, unspecified, unspecified trimester: Secondary | ICD-10-CM

## 2023-01-19 DIAGNOSIS — Z3A40 40 weeks gestation of pregnancy: Secondary | ICD-10-CM

## 2023-01-19 LAB — RPR: RPR Ser Ql: NONREACTIVE

## 2023-01-19 LAB — CBC
HCT: 35.3 % — ABNORMAL LOW (ref 36.0–46.0)
Hemoglobin: 11.9 g/dL — ABNORMAL LOW (ref 12.0–15.0)
MCH: 32.1 pg (ref 26.0–34.0)
MCHC: 33.7 g/dL (ref 30.0–36.0)
MCV: 95.1 fL (ref 80.0–100.0)
Platelets: 167 10*3/uL (ref 150–400)
RBC: 3.71 MIL/uL — ABNORMAL LOW (ref 3.87–5.11)
RDW: 13.9 % (ref 11.5–15.5)
WBC: 6.4 10*3/uL (ref 4.0–10.5)
nRBC: 0 % (ref 0.0–0.2)

## 2023-01-19 LAB — TYPE AND SCREEN
ABO/RH(D): A POS
Antibody Screen: NEGATIVE

## 2023-01-19 MED ORDER — LACTATED RINGERS IV SOLN
500.0000 mL | Freq: Once | INTRAVENOUS | Status: AC
  Administered 2023-01-19: 500 mL via INTRAVENOUS

## 2023-01-19 MED ORDER — FENTANYL CITRATE (PF) 100 MCG/2ML IJ SOLN: 50.0000 ug | INTRAMUSCULAR | Status: DC | PRN

## 2023-01-19 MED ORDER — DEXTROSE IN LACTATED RINGERS 5 % IV SOLN: INTRAVENOUS | Status: DC

## 2023-01-19 MED ORDER — FENTANYL-BUPIVACAINE-NACL 0.5-0.125-0.9 MG/250ML-% EP SOLN
12.0000 mL/h | EPIDURAL | Status: DC | PRN
  Filled 2023-01-19: qty 250

## 2023-01-19 MED ORDER — OXYTOCIN BOLUS FROM INFUSION
333.0000 mL | Freq: Once | INTRAVENOUS | Status: AC
  Administered 2023-01-19: 333 mL via INTRAVENOUS

## 2023-01-19 MED ORDER — OXYTOCIN-SODIUM CHLORIDE 30-0.9 UT/500ML-% IV SOLN
1.0000 m[IU]/min | INTRAVENOUS | Status: DC
  Administered 2023-01-19: 2 m[IU]/min via INTRAVENOUS
  Filled 2023-01-19: qty 500

## 2023-01-19 MED ORDER — EPHEDRINE 5 MG/ML INJ: 10.0000 mg | INTRAVENOUS | Status: DC | PRN

## 2023-01-19 MED ORDER — LACTATED RINGERS IV SOLN: INTRAVENOUS | Status: DC

## 2023-01-19 MED ORDER — PHENYLEPHRINE 80 MCG/ML (10ML) SYRINGE FOR IV PUSH (FOR BLOOD PRESSURE SUPPORT): 80.0000 ug | PREFILLED_SYRINGE | INTRAVENOUS | Status: DC | PRN

## 2023-01-19 MED ORDER — LIDOCAINE HCL (PF) 1 % IJ SOLN: 30.0000 mL | INTRAMUSCULAR | Status: DC | PRN

## 2023-01-19 MED ORDER — FENTANYL-BUPIVACAINE-NACL 0.5-0.125-0.9 MG/250ML-% EP SOLN
EPIDURAL | Status: DC | PRN
  Administered 2023-01-19: 12 mL/h via EPIDURAL

## 2023-01-19 MED ORDER — TERBUTALINE SULFATE 1 MG/ML IJ SOLN: 0.2500 mg | Freq: Once | INTRAMUSCULAR | Status: DC | PRN

## 2023-01-19 MED ORDER — SOD CITRATE-CITRIC ACID 500-334 MG/5ML PO SOLN: 30.0000 mL | ORAL | Status: DC | PRN

## 2023-01-19 MED ORDER — DIPHENHYDRAMINE HCL 50 MG/ML IJ SOLN: 12.5000 mg | INTRAMUSCULAR | Status: DC | PRN

## 2023-01-19 MED ORDER — MISOPROSTOL 200 MCG PO TABS
800.0000 ug | ORAL_TABLET | Freq: Once | ORAL | Status: AC
  Administered 2023-01-19: 800 ug via RECTAL

## 2023-01-19 MED ORDER — MISOPROSTOL 200 MCG PO TABS
ORAL_TABLET | ORAL | Status: AC
  Filled 2023-01-19: qty 4

## 2023-01-19 MED ORDER — ACETAMINOPHEN 325 MG PO TABS: 650.0000 mg | ORAL_TABLET | ORAL | Status: DC | PRN

## 2023-01-19 MED ORDER — LACTATED RINGERS IV SOLN: 500.0000 mL | INTRAVENOUS | Status: DC | PRN

## 2023-01-19 MED ORDER — ONDANSETRON HCL 4 MG/2ML IJ SOLN: 4.0000 mg | Freq: Four times a day (QID) | INTRAMUSCULAR | Status: DC | PRN

## 2023-01-19 MED ORDER — LIDOCAINE HCL (PF) 1 % IJ SOLN
INTRAMUSCULAR | Status: DC | PRN
  Administered 2023-01-19: 2 mL via EPIDURAL
  Administered 2023-01-19: 10 mL via EPIDURAL

## 2023-01-19 MED ORDER — TRANEXAMIC ACID-NACL 1000-0.7 MG/100ML-% IV SOLN
1000.0000 mg | Freq: Once | INTRAVENOUS | Status: AC
  Administered 2023-01-19: 1000 mg via INTRAVENOUS
  Filled 2023-01-19: qty 100

## 2023-01-19 MED ORDER — OXYTOCIN-SODIUM CHLORIDE 30-0.9 UT/500ML-% IV SOLN
2.5000 [IU]/h | INTRAVENOUS | Status: DC
  Administered 2023-01-19: 2.5 [IU]/h via INTRAVENOUS

## 2023-01-19 MED ORDER — OXYCODONE-ACETAMINOPHEN 5-325 MG PO TABS: 1.0000 | ORAL_TABLET | ORAL | Status: DC | PRN

## 2023-01-19 NOTE — H&P (Addendum)
OBSTETRIC ADMISSION HISTORY AND PHYSICAL  [name] [name] is a 37 y.o. female [phone] with IUP at [redacted]w[redacted]d by LMP presenting for IOL AMA. She reports +FMs, No LOF, no VB, no blurry vision, headaches or peripheral edema, and RUQ pain.  She plans on breast and bottle feeding. She requests nothing for birth control. She received her prenatal care at  [facility] . She presented late to the [facility] practice, received the majority of her care and an early [place] in [place]. Denies known complications. Denies history of prior c-sections. She does not a prior surgery for ectopic pregnancy.   Dating: By LMP --->  Estimated Date of Delivery: 01/16/23  No recent US to refer to. Appears it was ordered at 39 weeks, but I can not find these records   Prenatal History/Complications: AMA  Past Medical History: Past Medical History:  Diagnosis Date   Vaginal Pap smear, abnormal     Past Surgical History: Past Surgical History:  Procedure Laterality Date   ECTOPIC PREGNANCY SURGERY      Obstetrical History: OB History     Gravida  7   Para  4   Term  4   Preterm      AB  2   Living  4      SAB  0   IAB  1   Ectopic  1   Multiple      Live Births  4           Social History Social History   Socioeconomic History   Marital status: Widowed    Spouse name: Not on file   Number of children: Not on file   Years of education: Not on file   Highest education level: Not on file  Occupational History   Not on file  Tobacco Use   Smoking status: Former    Packs/day: 0.25    Types: Cigarettes   Smokeless tobacco: Never  Vaping Use   Vaping Use: Never used  Substance and Sexual Activity   Alcohol use: No   Drug use: No   Sexual activity: Yes  Other Topics Concern   Not on file  Social History Narrative   Not on file   Social Determinants of Health   Financial Resource Strain: Not on file  Food Insecurity: Not on file  Transportation Needs: Not on file  Physical Activity: Not on  file  Stress: Not on file  Social Connections: Not on file    Family History: Family History  Problem Relation Age of Onset   Alcohol abuse Neg Hx    Arthritis Neg Hx    Asthma Neg Hx    Birth defects Neg Hx    Cancer Neg Hx    COPD Neg Hx    Depression Neg Hx    Diabetes Neg Hx    Drug abuse Neg Hx    Early death Neg Hx    Hearing loss Neg Hx    Heart disease Neg Hx    Hyperlipidemia Neg Hx    Hypertension Neg Hx    Kidney disease Neg Hx    Learning disabilities Neg Hx    Mental illness Neg Hx    Mental retardation Neg Hx    Stroke Neg Hx    Vision loss Neg Hx    Miscarriages / Stillbirths Neg Hx    Varicose Veins Neg Hx     Allergies: No Known Allergies  Medications Prior to Admission  Medication Sig Dispense Refill Last  Dose   metroNIDAZOLE (FLAGYL) 500 MG tablet Take 1 tablet (500 mg total) by mouth 2 (two) times daily. 14 tablet 0    Prenatal Vit-Fe Fumarate-FA (PREPLUS) 27-1 MG TABS Take 1 tablet by mouth daily. 30 tablet 13      Review of Systems   All systems reviewed and negative except as stated in HPI  Last menstrual period 04/11/2022, currently breastfeeding. General appearance: alert, cooperative, and appears stated age Lungs: clear to auscultation bilaterally Heart: regular rate and rhythm Abdomen: soft, non-tender; bowel sounds normal Extremities: Homans sign is negative, no sign of DVT Presentation: cephalic Fetal monitoringBaseline: 135 bpm, Variability: Good {> 6 bpm), Accelerations: Reactive, and Decelerations: Absent Uterine activityNone    Clinic  [facility]  Dating  6 wk ultrasound  Genetic Screen           Quad:       Elevated DSR 1:258 (Declined further testing) NIPS: Declined  Anatomic Korea  nl  GTT Early:               Third trimester: 13  TDaP vaccine 08/11/14  Flu vaccine  08/27/14  GBS  negative   Contraception pills  Baby Food  Breast  Circumcision  Undecided  Pediatrician    Support Person  Father of baby        Prenatal  labs: ABO, Rh: A/Positive/-- (01/22 1647) Antibody: Negative (01/22 1647) Rubella: 2.52 (01/22 1647) RPR: Non Reactive (01/22 1647)  HBsAg: Negative (01/22 1647)  HIV: Non Reactive (01/22 1647)  GBS: Negative/-- (01/22 1647)   Genetic screening  declined  Prenatal Transfer Tool  Maternal Diabetes: No Genetic Screening: elevated DSR 1:258, NIPS declined Maternal Ultrasounds/Referrals: Normal Fetal Ultrasounds or other Referrals:  None Maternal Substance Abuse:  No Significant Maternal Medications:  None Significant Maternal Lab Results:  Group B Strep negative Number of Prenatal Visits:Late transfer of care, received most of her care in [place] Other Comments:  None  No results found for this or any previous visit (from the past 24 hour(s)).  Patient Active Problem List   Diagnosis Date Noted   Supervision of high risk pregnancy, antepartum 01/09/2023   Advanced maternal age in multigravida, third trimester 01/09/2023    Assessment/Plan:  [name] [name] is a 37 y.o. [id number] at [redacted]w[redacted]d here for IOL for AMA.   #Labor: Will initiate induction with pitocin #Pain: Epidural #FWB: Cat I #ID:  GBS neg #MOF: both #MOC: none   Abram Sander, MD  01/19/2023, 10:43 AM  Fellow Attestation  I saw and evaluated the patient, performing the key elements of the service.I  personally performed or re-performed the history, physical exam, and medical decision making activities of this service and have verified that the service and findings are accurately documented in the resident's note. I developed the management plan that is described in the resident's note, and I agree with the content, with my edits above.    Gifford Shave, MD OB Fellow

## 2023-01-19 NOTE — Progress Notes (Signed)
Chaplain introduced spiritual care and offered support to pt and her daughters, Sharyn Lull and Aldona Bar. Amica desires prayer of gratitude over her daughter Geraldo Pitter at birth. Our team will continue to follow.  Please page as further needs arise.  Donald Prose. Elyn Peers, M.Div. [facility] Chaplain Pager [phone] Office [phone]

## 2023-01-19 NOTE — Discharge Summary (Addendum)
Postpartum Discharge Summary    Patient Name: [name] [name] DOB: [birth year] MRN: [id number]  Date of admission: 01/19/2023 Delivery date:01/19/2023  Delivering provider: Christin Fudge  Date of discharge: 01/21/2023  Admitting diagnosis: Post term pregnancy over 40 weeks [O48.0] Intrauterine pregnancy: [redacted]w[redacted]d     Secondary diagnosis:  Principal Problem:   Vaginal delivery Active Problems:   Supervision of high risk pregnancy, antepartum   Advanced maternal age in multigravida, third trimester   Post term pregnancy over 40 weeks  Additional problems: n/a    Discharge diagnosis: Term Pregnancy Delivered                                              Post partum procedures: n/a Augmentation: AROM and Pitocin Complications: None  Hospital course: Induction of Labor With Vaginal Delivery   37 y.o. yo [id number] at [redacted]w[redacted]d was admitted to the hospital 01/19/2023 for induction of labor.  Indication for induction: Elective.  Patient had an labor course complicated by nothing Membrane Rupture Time/Date: 3:45 PM ,01/19/2023   Delivery Method:Vaginal, Spontaneous  Episiotomy: None  Lacerations:  None  Details of delivery can be found in separate delivery note.  Patient had a postpartum course complicated by none. Patient is discharged home 01/21/23.  Newborn Data: Birth date:01/19/2023  Birth time:9:02 PM  Gender:Female  Living status:Living  Apgars:9 ,9  Weight:3740 g   Magnesium Sulfate received: No BMZ received: No Rhophylac:N/A MMR:N/A T-DaP: deferred Flu: Yes Transfusion:No  Physical exam  Vitals:   01/20/23 0900 01/20/23 1313 01/20/23 2058 01/21/23 0523  BP: 118/65 99/63 97/72  104/77  Pulse: 97 98 93 (!) 55  Resp: 18 18 18 18   Temp: 98.6 F (37 C) 98 F (36.7 C) 97.8 F (36.6 C) 97.7 F (36.5 C)  TempSrc: Oral Oral Oral Oral  SpO2: 98% 98% 98% 97%  Weight:      Height:       General: alert, cooperative, and no distress Lochia: appropriate Uterine Fundus:  firm Incision: N/A DVT Evaluation: No evidence of DVT seen on physical exam. Labs: Lab Results  Component Value Date   WBC 6.4 01/19/2023   HGB 11.9 (L) 01/19/2023   HCT 35.3 (L) 01/19/2023   MCV 95.1 01/19/2023   PLT 167 01/19/2023       No data to display         Edinburgh Score:    01/20/2023    9:00 AM  Edinburgh Postnatal Depression Scale Screening Tool  I have been able to laugh and see the funny side of things. 0  I have looked forward with enjoyment to things. 0  I have blamed myself unnecessarily when things went wrong. 0  I have been anxious or worried for no good reason. 0  I have felt scared or panicky for no good reason. 0  Things have been getting on top of me. 0  I have been so unhappy that I have had difficulty sleeping. 0  I have felt sad or miserable. 0  I have been so unhappy that I have been crying. 0  The thought of harming myself has occurred to me. 0  Edinburgh Postnatal Depression Scale Total 0     After visit meds:  Allergies as of 01/21/2023   No Known Allergies      Medication List     STOP taking these medications  metroNIDAZOLE 500 MG tablet Commonly known as: FLAGYL       TAKE these medications    acetaminophen 325 MG tablet Commonly known as: Tylenol Take 2 tablets (650 mg total) by mouth every 4 (four) hours as needed (for pain scale < 4).   benzocaine-Menthol 20-0.5 % Aero Commonly known as: DERMOPLAST Apply 1 Application topically as needed for irritation (perineal discomfort).   ferrous sulfate 325 (65 FE) MG tablet Take 1 tablet (325 mg total) by mouth every other day. Start taking on: January 22, 2023   HYDROcodone-acetaminophen 10-325 MG tablet Commonly known as: NORCO Take 1 tablet by mouth every 4 (four) hours as needed for up to 5 doses.   ibuprofen 600 MG tablet Commonly known as: ADVIL Take 1 tablet (600 mg total) by mouth every 6 (six) hours.   prenatal multivitamin Tabs tablet Take 1 tablet by  mouth daily. What changed: medication strength   senna-docusate 8.6-50 MG tablet Commonly known as: Senokot-S Take 2 tablets by mouth daily.         Discharge home in stable condition Infant Feeding: Bottle and Breast Infant Disposition:home with mother Discharge instruction: per After Visit Summary and Postpartum booklet. Activity: Advance as tolerated. Pelvic rest for 6 weeks.  Diet: routine diet Future Appointments:No future appointments. Follow up Visit:   Please schedule this patient for a In person postpartum visit in 4 weeks with the following provider: Any provider. Additional Postpartum F/U:  Low risk pregnancy complicated by:  AMA Delivery mode:  Vaginal, Spontaneous  Anticipated Birth Control:   declines   01/21/2023 Shelda Pal, DO

## 2023-01-19 NOTE — Progress Notes (Signed)
LABOR PROGRESS NOTE  [name] [name] is a 37 y.o. [id number] at [redacted]w[redacted]d  admitted for IOL for AMA  Subjective: Doing well with epidural in place.   Objective: BP 105/65   Pulse 96   Temp 97.9 F (36.6 C) (Oral)   Resp 15   Ht 5\' 2"  (1.575 m)   Wt 75.4 kg   LMP 04/11/2022   SpO2 99%   BMI 30.40 kg/m  or  Vitals:   01/19/23 1500 01/19/23 1505 01/19/23 1510 01/19/23 1511  BP: 111/65 118/73 105/65   Pulse: 94 93 96   Resp: 15     Temp: 97.9 F (36.6 C)     TempSrc: Oral     SpO2: 100% 100%  99%  Weight:      Height:         Dilation: 5 Effacement (%): 70 Station: -2, -1 Presentation: Vertex Exam by:: Dr. Caron Presume FHT: baseline rate 120, moderate varibility, +acel, no decel Toco: q 2-3 min  Labs: Lab Results  Component Value Date   WBC 6.4 01/19/2023   HGB 11.9 (L) 01/19/2023   HCT 35.3 (L) 01/19/2023   MCV 95.1 01/19/2023   PLT 167 01/19/2023    Patient Active Problem List   Diagnosis Date Noted   Post term pregnancy over 40 weeks 01/19/2023   Supervision of high risk pregnancy, antepartum 01/09/2023   Advanced maternal age in multigravida, third trimester 01/09/2023    Assessment / Plan: 37 y.o. [id number] at [redacted]w[redacted]d here for IOL for AMA  Labor: Progressing well, pitocin at 21. AROM at this check with clear fluid.  Fetal Wellbeing:  Cat 1 Pain Control:  Epidural in place Anticipated MOD:  Vaginal   Gifford Shave, MD  OB Fellow  01/19/2023, 4:04 PM

## 2023-01-19 NOTE — Anesthesia Procedure Notes (Signed)
Epidural Patient location during procedure: OB Start time: 01/19/2023 2:33 PM End time: 01/19/2023 2:40 PM  Staffing Anesthesiologist: Pervis Hocking, DO Performed: anesthesiologist   Preanesthetic Checklist Completed: patient identified, IV checked, risks and benefits discussed, monitors and equipment checked, pre-op evaluation and timeout performed  Epidural Patient position: sitting Prep: DuraPrep and site prepped and draped Patient monitoring: continuous pulse ox, blood pressure, heart rate and cardiac monitor Approach: midline Location: L3-L4 Injection technique: LOR air  Needle:  Needle type: Tuohy  Needle gauge: 17 G Needle length: 9 cm Needle insertion depth: 5 cm Catheter type: closed end flexible Catheter size: 19 Gauge Catheter at skin depth: 10 cm Test dose: negative  Assessment Sensory level: T8 Events: blood not aspirated, no cerebrospinal fluid, injection not painful, no injection resistance, no paresthesia and negative IV test  Additional Notes Patient identified. Risks/Benefits/Options discussed with patient including but not limited to bleeding, infection, nerve damage, paralysis, failed block, incomplete pain control, headache, blood pressure changes, nausea, vomiting, reactions to medication both or allergic, itching and postpartum back pain. Confirmed with bedside nurse the patient's most recent platelet count. Confirmed with patient that they are not currently taking any anticoagulation, have any bleeding history or any family history of bleeding disorders. Patient expressed understanding and wished to proceed. All questions were answered. Sterile technique was used throughout the entire procedure. Please see nursing notes for vital signs. Test dose was given through epidural catheter and negative prior to continuing to dose epidural or start infusion. Warning signs of high block given to the patient including shortness of breath, tingling/numbness in  hands, complete motor block, or any concerning symptoms with instructions to call for help. Patient was given instructions on fall risk and not to get out of bed. All questions and concerns addressed with instructions to call with any issues or inadequate analgesia.  Reason for block:procedure for pain

## 2023-01-19 NOTE — Anesthesia Preprocedure Evaluation (Signed)
Anesthesia Evaluation  Patient identified by MRN, date of birth, ID band Patient awake    Reviewed: Allergy & Precautions, Patient's Chart, lab work & pertinent test results  Airway Mallampati: II  TM Distance: >3 FB Neck ROM: Full    Dental  (+) Dental Advisory Given   Pulmonary neg pulmonary ROS, former smoker   Pulmonary exam normal breath sounds clear to auscultation       Cardiovascular negative cardio ROS Normal cardiovascular exam Rhythm:Regular Rate:Normal     Neuro/Psych negative neurological ROS  negative psych ROS   GI/Hepatic negative GI ROS, Neg liver ROS,,,  Endo/Other  negative endocrine ROS    Renal/GU negative Renal ROS  negative genitourinary   Musculoskeletal negative musculoskeletal ROS (+)    Abdominal   Peds negative pediatric ROS (+)  Hematology  (+) Blood dyscrasia, anemia Hb 11.9, plt 167   Anesthesia Other Findings   Reproductive/Obstetrics (+) Pregnancy                             Anesthesia Physical Anesthesia Plan  ASA: 2  Anesthesia Plan: Epidural   Post-op Pain Management:    Induction:   PONV Risk Score and Plan: 2  Airway Management Planned: Natural Airway  Additional Equipment: None  Intra-op Plan:   Post-operative Plan:   Informed Consent: I have reviewed the patients History and Physical, chart, labs and discussed the procedure including the risks, benefits and alternatives for the proposed anesthesia with the patient or authorized representative who has indicated his/her understanding and acceptance.       Plan Discussed with:   Anesthesia Plan Comments:        Anesthesia Quick Evaluation

## 2023-01-19 NOTE — Progress Notes (Signed)
LABOR PROGRESS NOTE  [name] KRETZSCHMAR is a 37 y.o. [id number] at [redacted]w[redacted]d  admitted for IOL for AMA.   Subjective: Still doing well with epidural in place.   Objective: BP 104/69   Pulse 90   Temp 97.9 F (36.6 C) (Oral)   Resp 15   Ht 5\' 2"  (1.575 m)   Wt 75.4 kg   LMP 04/11/2022   SpO2 99%   BMI 30.40 kg/m    Vitals:   01/19/23 1731 01/19/23 1800 01/19/23 1831 01/19/23 1841  BP: 106/68 100/65 93/66 104/69  Pulse: 92 87 86 90  Resp:      Temp:      TempSrc:      SpO2:      Weight:      Height:         Dilation: 5.5 Effacement (%): 80 Station: -1 Presentation: Vertex Exam by:: Dr. Caron Presume FHT: baseline rate 120, moderate varibility, +acel, no decel Toco: q 2-3 min  Labs: Lab Results  Component Value Date   WBC 6.4 01/19/2023   HGB 11.9 (L) 01/19/2023   HCT 35.3 (L) 01/19/2023   MCV 95.1 01/19/2023   PLT 167 01/19/2023    Patient Active Problem List   Diagnosis Date Noted   Post term pregnancy over 40 weeks 01/19/2023   Supervision of high risk pregnancy, antepartum 01/09/2023   Advanced maternal age in multigravida, third trimester 01/09/2023    Assessment / Plan: 37 y.o. [id number] at [redacted]w[redacted]d here for IOL for AMA  GBS- Pitocin 2x2 AROM @1545   Labor: IUPC placed, contractions  not adequate. Will continue to titrate pitocin up, currently at 10.  Fetal Wellbeing:  Cat 1 Pain Control:  Epidural in place Anticipated MOD:  Vaginal   Abram Sander MD, PGY-1  01/19/2023, 7:00 PM

## 2023-01-20 MED ORDER — ONDANSETRON HCL 4 MG/2ML IJ SOLN: 4.0000 mg | INTRAMUSCULAR | Status: DC | PRN

## 2023-01-20 MED ORDER — BENZOCAINE-MENTHOL 20-0.5 % EX AERO: 1.0000 | INHALATION_SPRAY | CUTANEOUS | Status: DC | PRN

## 2023-01-20 MED ORDER — IBUPROFEN 600 MG PO TABS
600.0000 mg | ORAL_TABLET | Freq: Four times a day (QID) | ORAL | Status: DC
  Administered 2023-01-20 – 2023-01-21 (×5): 600 mg via ORAL
  Filled 2023-01-20 (×5): qty 1

## 2023-01-20 MED ORDER — COCONUT OIL OIL
1.0000 | TOPICAL_OIL | Status: DC | PRN
  Administered 2023-01-21: 1 via TOPICAL

## 2023-01-20 MED ORDER — SENNOSIDES-DOCUSATE SODIUM 8.6-50 MG PO TABS
2.0000 | ORAL_TABLET | ORAL | Status: DC
  Administered 2023-01-20 – 2023-01-21 (×2): 2 via ORAL
  Filled 2023-01-20 (×2): qty 2

## 2023-01-20 MED ORDER — WITCH HAZEL-GLYCERIN EX PADS: 1.0000 | MEDICATED_PAD | CUTANEOUS | Status: DC | PRN

## 2023-01-20 MED ORDER — BISACODYL 10 MG RE SUPP: 10.0000 mg | Freq: Every day | RECTAL | Status: DC | PRN

## 2023-01-20 MED ORDER — MEDROXYPROGESTERONE ACETATE 150 MG/ML IM SUSP: 150.0000 mg | INTRAMUSCULAR | Status: DC | PRN

## 2023-01-20 MED ORDER — OXYCODONE HCL 5 MG PO TABS
5.0000 mg | ORAL_TABLET | Freq: Four times a day (QID) | ORAL | Status: DC | PRN
  Administered 2023-01-20 (×2): 5 mg via ORAL
  Filled 2023-01-20 (×3): qty 1

## 2023-01-20 MED ORDER — TETANUS-DIPHTH-ACELL PERTUSSIS 5-2.5-18.5 LF-MCG/0.5 IM SUSY: 0.5000 mL | PREFILLED_SYRINGE | Freq: Once | INTRAMUSCULAR | Status: DC

## 2023-01-20 MED ORDER — SIMETHICONE 80 MG PO CHEW: 80.0000 mg | CHEWABLE_TABLET | ORAL | Status: DC | PRN

## 2023-01-20 MED ORDER — ACETAMINOPHEN 325 MG PO TABS
650.0000 mg | ORAL_TABLET | ORAL | Status: DC | PRN
  Administered 2023-01-20 – 2023-01-21 (×4): 650 mg via ORAL
  Filled 2023-01-20 (×5): qty 2

## 2023-01-20 MED ORDER — DIPHENHYDRAMINE HCL 25 MG PO CAPS: 25.0000 mg | ORAL_CAPSULE | Freq: Four times a day (QID) | ORAL | Status: DC | PRN

## 2023-01-20 MED ORDER — MEASLES, MUMPS & RUBELLA VAC IJ SOLR: 0.5000 mL | Freq: Once | INTRAMUSCULAR | Status: DC

## 2023-01-20 MED ORDER — FLEET ENEMA 7-19 GM/118ML RE ENEM: 1.0000 | ENEMA | Freq: Every day | RECTAL | Status: DC | PRN

## 2023-01-20 MED ORDER — METHYLERGONOVINE MALEATE 0.2 MG/ML IJ SOLN: 0.2000 mg | INTRAMUSCULAR | Status: DC | PRN

## 2023-01-20 MED ORDER — PRENATAL MULTIVITAMIN CH
1.0000 | ORAL_TABLET | Freq: Every day | ORAL | Status: DC
  Administered 2023-01-20 – 2023-01-21 (×2): 1 via ORAL
  Filled 2023-01-20 (×2): qty 1

## 2023-01-20 MED ORDER — FERROUS SULFATE 325 (65 FE) MG PO TABS
325.0000 mg | ORAL_TABLET | ORAL | Status: DC
  Administered 2023-01-20: 325 mg via ORAL
  Filled 2023-01-20: qty 1

## 2023-01-20 MED ORDER — METHYLERGONOVINE MALEATE 0.2 MG PO TABS: 0.2000 mg | ORAL_TABLET | ORAL | Status: DC | PRN

## 2023-01-20 MED ORDER — DIBUCAINE (PERIANAL) 1 % EX OINT: 1.0000 | TOPICAL_OINTMENT | CUTANEOUS | Status: DC | PRN

## 2023-01-20 MED ORDER — ONDANSETRON HCL 4 MG PO TABS: 4.0000 mg | ORAL_TABLET | ORAL | Status: DC | PRN

## 2023-01-20 NOTE — Anesthesia Postprocedure Evaluation (Signed)
Anesthesia Post Note  Patient: [name] [name]  Procedure(s) Performed: AN AD HOC LABOR EPIDURAL     Patient location during evaluation: Mother Baby Anesthesia Type: Epidural Level of consciousness: awake, oriented and awake and alert Pain management: pain level controlled Vital Signs Assessment: post-procedure vital signs reviewed and stable Respiratory status: spontaneous breathing, respiratory function stable and nonlabored ventilation Cardiovascular status: stable Postop Assessment: no headache, adequate PO intake, able to ambulate, patient able to bend at knees and no apparent nausea or vomiting Anesthetic complications: no   No notable events documented.  Last Vitals:  Vitals:   01/20/23 0050 01/20/23 0442  BP: 123/81 108/65  Pulse: (!) 107 (!) 109  Resp: 18 19  Temp: 36.9 C 36.8 C  SpO2: 100% 98%    Last Pain:  Vitals:   01/20/23 0442  TempSrc: Oral  PainSc:    Pain Goal:                   Eran Mistry

## 2023-01-20 NOTE — Clinical Social Work Maternal (Signed)
CLINICAL SOCIAL WORK MATERNAL/CHILD NOTE  Patient Details  Name: [name] [name] MRN: [id number] Date of Birth: 07/07/1986  Date:  01/20/2023  Clinical Social Worker Initiating Note:  Bernadene Bell Minnie Legros Date/Time: Initiated:  01/20/23/1431     Child's Name:  [name] [name]   Biological Parents:  Mother [name] [name] 1986-07-26)   Need for Interpreter:  None   Reason for Referral:  Late or No Prenatal Care     Address:  [address] [place] [address]    Phone number:  [phone] (home)     Additional phone number:   Household Members/Support Persons (HM/SP):   Household Member/Support Person 1, Household Member/Support Person 2, Household Member/Support Person 3, Household Member/Support Person 4   HM/SP Name Relationship DOB or Age  HM/SP -1 [name] [name] son 04/10/2002  HM/SP -2 [name] [name] daughter 10/08/2003  HM/SP -3 [name] [name] daughter 05/11/2007  HM/SP -4 [name] [name]   10/01/2014  HM/SP -5        HM/SP -6        HM/SP -7        HM/SP -8          Natural Supports (not living in the home):      Professional Supports: None   Employment:     Type of Work:     Education:  Programmer, systems   Homebound arranged:    Museum/gallery curator Resources:      Other Resources:      Cultural/Religious Considerations Which May Impact Care:    Strengths:  Ability to meet basic needs  , Home prepared for child  , Pediatrician chosen   Psychotropic Medications:         Pediatrician:    [place] area  Pediatrician List:   [facility]      Pediatrician Fax Number:    Risk Factors/Current Problems:  None   Cognitive State:  Able to Concentrate  , Alert     Mood/Affect:  Calm     CSW Assessment: CSW received consult for limited prenatal care. CSW met with MOB to offer support and complete assessment. CSW entered the  room and observed MOB resting in bed and 2 guest at bedside. CSW arranged [employer] language services interpreter Randall Hiss [phone]. CSW introduced self, CSW  role and reason for visit. MOB was agreeable to visit and allowed her guest to remain in the room. CSW inquired about how MOB was feeling, MOB reported good. CSW confirmed MOB demographic info  CSW inquired about MOB lack of prenatal care, MOB reported she was in [place] taking care of her mother and had prenatal care there, MOB was not able to obtain records. CSW explained hospital drug screen policy, MOB voiced understanding. MOB reported no barriers, reported she had transportation.  CSW inquired about any MH diagnosis, MOB denied. CSW provided education regarding the baby blues period vs. perinatal mood disorders, discussed treatment and gave resources for mental health follow up if concerns arise.  CSW recommends self-evaluation during the postpartum time period using the New Mom Checklist from Postpartum Progress and encouraged MOB to contact a medical professional if symptoms are noted at any time. MOB reported she had good support for her family and friends.  CSW provided review of Sudden Infant Death Syndrome (SIDS) precautions.  MOB reported she has  a crib for the infant to sleep. CSW identifies no further need for intervention and no barriers to discharge at this time.  CSW Plan/Description:  No Further Intervention Required/No Barriers to Discharge, Sudden Infant Death Syndrome (SIDS) Education, Perinatal Mood and Anxiety Disorder (PMADs) Education, CSW Will Continue to Monitor Umbilical Cord Tissue Drug Screen Results and Make Report if Warranted, [place], [place] 01/20/2023, 2:39 PM

## 2023-01-20 NOTE — Lactation Note (Signed)
This note was copied from a baby's chart. Lactation Consultation Note  Patient Name: [name] [name] [id number] Date: 01/20/2023   Age:37 hours RN will ask Birth Parent at next Birth Parent and infant assessment if Birth Parent would like to be seen by [facility] services on [place], RN will call [place] on Vocera.  Maternal Data    Feeding    LATCH Score                    Lactation Tools Discussed/Used    Interventions    Discharge    Consult Status      [name] 01/20/2023, 1:20 AM

## 2023-01-20 NOTE — Progress Notes (Addendum)
POSTPARTUM PROGRESS NOTE  Post Partum Day 1  Subjective:  [name] [name] is a 37 y.o. [id number] s/p NSVD at [redacted]w[redacted]d.  No acute events overnight.  Pt denies problems with ambulating, voiding or po intake.  She denies nausea or vomiting.  Pain is moderately well controlled, rates as a 7/10.  She has not had flatus. She has not had bowel movement.  Lochia Moderate.   Objective: Blood pressure 108/65, pulse (!) 109, temperature 98.2 F (36.8 C), temperature source Oral, resp. rate 19, height 5\' 2"  (1.575 m), weight 75.4 kg, last menstrual period 04/11/2022, SpO2 98 %, unknown if currently breastfeeding.  Physical Exam:  General: alert, cooperative and no distress Chest: no respiratory distress Heart:regular rate, distal pulses intact Abdomen: soft, nontender,  Uterine Fundus: firm, appropriately tender DVT Evaluation: No calf swelling or tenderness Extremities: No edema Skin: warm, dry  Recent Labs    01/19/23 1148  HGB 11.9*  HCT 35.3*    Assessment/Plan: [name] [name] is a 37 y.o. [id number] s/p NSVD at [redacted]w[redacted]d   PPD#1 - Doing well Contraception: declines Feeding: bottle Dispo: Plan for discharge tomorrow.   LOS: 1 day   [name] Sander, MD PGY1 01/20/2023, 7:49 AM   Fellow Attestation  I saw and evaluated the patient, performing the [name] elements of the service.I  personally performed or re-performed the history, physical exam, and medical decision making activities of this service and have verified that the service and findings are accurately documented in the resident's note. I developed the management plan that is described in the resident's note, and I agree with the content, with my edits above.    Gifford Shave, MD OB Fellow

## 2023-01-20 NOTE — Lactation Note (Addendum)
This note was copied from a baby's chart. Lactation Consultation Note  Patient Name: [name] [name] [id number] Date: 01/20/2023 Reason for consult: Initial assessment;Term Age:37 hours Consult was done in Spanish:  Initial visit to 42 hours old infant. Birthing parent requests assistance with feeding. Demonstrated hand expression, collected ~1-mL of colostrum and spoonfed. Provided hand pump for colostrum expression, collected ~1-mL and spoonfed. Infant latches easily, observed deep latch, birthing parent reports no pain with latch. Reviewed positioning and neck/back support. Educated about formula supplementation including pacing and volume per age.   Discussed normal newborn behavior and patterns, signs of good milk transfer, hunger cues, tummy size and benefits of skin to skin.   Plan: 1-Feeding on demand or 8-12 times in 24h period. 2-Express/pump as needed 3-Follow recommendations when formula feeding 4-Encouraged birthing parent rest, hydration and food intake.   Contact LC as needed for feeds/support/concerns/questions. All questions answered at this time. Provided Lactation services brochure and promoted other resources.    Maternal Data Has patient been taught Hand Expression?: Yes Does the patient have breastfeeding experience prior to this delivery?: Yes How long did the patient breastfeed?: 62-months x2; 66-months x1; 44-month x1 (pain with latch)  Feeding Mother's Current Feeding Choice: Breast Milk and Formula  LATCH Score Latch: Grasps breast easily, tongue down, lips flanged, rhythmical sucking.  Audible Swallowing: Spontaneous and intermittent  Type of Nipple: Everted at rest and after stimulation  Comfort (Breast/Nipple): Soft / non-tender  Hold (Positioning): Assistance needed to correctly position infant at breast and maintain latch.  LATCH Score: 9   Lactation Tools Discussed/Used Tools: Pump;Flanges Flange Size: 21 Breast pump type: Manual Pump  Education: Setup, frequency, and cleaning;Milk Storage Reason for Pumping: mother's request Pumping frequency: as needed Pumped volume: 2 mL  Interventions Interventions: Breast feeding basics reviewed;Assisted with latch;Skin to skin;Breast massage;Hand express;Breast compression;Hand pump;Expressed milk;Position options;Support pillows;Adjust position;Education;Pace feeding;LC Services brochure  Discharge Pump: Manual WIC Program: No (will apply)  Consult Status Consult Status: Follow-up Date: 01/21/23 Follow-up type: In-patient    [name] [name] 01/20/2023, 12:50 PM

## 2023-01-21 MED ORDER — BENZOCAINE-MENTHOL 20-0.5 % EX AERO: 1.0000 | INHALATION_SPRAY | CUTANEOUS | 0 refills | Status: AC | PRN

## 2023-01-21 MED ORDER — SENNOSIDES-DOCUSATE SODIUM 8.6-50 MG PO TABS: 2.0000 | ORAL_TABLET | ORAL | 0 refills | Status: AC

## 2023-01-21 MED ORDER — ACETAMINOPHEN 325 MG PO TABS: 650.0000 mg | ORAL_TABLET | ORAL | 0 refills | Status: DC | PRN

## 2023-01-21 MED ORDER — HYDROCODONE-ACETAMINOPHEN 10-325 MG PO TABS: 1.0000 | ORAL_TABLET | ORAL | 0 refills | Status: AC | PRN

## 2023-01-21 MED ORDER — ACETAMINOPHEN 325 MG PO TABS: 650.0000 mg | ORAL_TABLET | ORAL | 0 refills | Status: AC | PRN

## 2023-01-21 MED ORDER — PRENATAL MULTIVITAMIN CH: 1.0000 | ORAL_TABLET | Freq: Every day | ORAL | 0 refills | Status: AC

## 2023-01-21 MED ORDER — SENNOSIDES-DOCUSATE SODIUM 8.6-50 MG PO TABS: 2.0000 | ORAL_TABLET | ORAL | 0 refills | Status: DC

## 2023-01-21 MED ORDER — BENZOCAINE-MENTHOL 20-0.5 % EX AERO: 1.0000 | INHALATION_SPRAY | CUTANEOUS | 0 refills | Status: DC | PRN

## 2023-01-21 MED ORDER — IBUPROFEN 600 MG PO TABS: 600.0000 mg | ORAL_TABLET | Freq: Four times a day (QID) | ORAL | 0 refills | Status: DC

## 2023-01-21 MED ORDER — IBUPROFEN 600 MG PO TABS: 600.0000 mg | ORAL_TABLET | Freq: Four times a day (QID) | ORAL | 0 refills | Status: AC

## 2023-01-21 MED ORDER — FERROUS SULFATE 325 (65 FE) MG PO TABS: 325.0000 mg | ORAL_TABLET | ORAL | 0 refills | Status: AC

## 2023-01-21 MED ORDER — FERROUS SULFATE 325 (65 FE) MG PO TABS: 325.0000 mg | ORAL_TABLET | ORAL | 0 refills | Status: DC

## 2023-01-21 MED ORDER — HYDROCODONE-ACETAMINOPHEN 10-325 MG PO TABS
1.0000 | ORAL_TABLET | Freq: Once | ORAL | Status: AC
  Administered 2023-01-21: 1 via ORAL
  Filled 2023-01-21: qty 1

## 2023-01-21 MED ORDER — BISACODYL 10 MG RE SUPP
10.0000 mg | Freq: Once | RECTAL | Status: AC
  Administered 2023-01-21: 10 mg via RECTAL
  Filled 2023-01-21: qty 1

## 2023-01-21 NOTE — Lactation Note (Signed)
This note was copied from a baby's chart. Lactation Consultation Note  Patient Name: [name] [name] [id number] Date: 01/21/2023 Reason for consult: Follow-up assessment;Mother's request;Difficult latch;Term;Breastfeeding assistance Age:37 hours  Birth parent speaks spanish. She preferred to have her support person interpret during the consult rather than using the iPad.  LC entered the room and the infant was being held by the birth parent.  She stated that then infant latches well, but tends to only stay on for 1-2 min.  The birth parent said that she was seeing some colostrum yesterday, but has not seen anything today.  [place] spoke with the birth parent about milk production, supply and demand, and the importance of stimulation.  The birth parent had been given coconut oil by the RN, but requested more for when she gets home.  The birth parent is aware to pump every 3 hours and she knows to slowly discontinue pumping when:  The infant latches and stays on the breast for at least 15 min (must be actively sucking the entire time).  Her milk volume has increased and transitioned from colostrum.  The birth parent will remove 1 pumping session every 2-3 days until she gets to her pumping goal after her milk has transitioned from colostrum.  [place] educated the birth parent on engorgement, mastitis, warning signs, infant I/O, and when to call the pediatrician.  All questions were answered.   Infant Feeding Plan:  Breastfeed according to feeding cues 8+ times in 24 hours.  Supplement according to supplementation guidelines.  Put the infant to the breast prior to supplementing.  Pump every 3 hours for stimulation and feed expressed milk to the infant via a bottle.  Watch infant output and call the pediatrician with questions or concerns.  Call the outpatient [facility] for assistance with breastfeeding.    Feeding Nipple Type: Slow - flow    Lactation Tools Discussed/Used Tools: Coconut oil (LC gave the  birth parent additional coconut oil at her request.)  Interventions Interventions: Breast feeding basics reviewed;Education  Discharge Discharge Education: Engorgement and breast care;Warning signs for feeding baby;Outpatient recommendation  Consult Status Consult Status: Complete Date: 01/21/23 Follow-up type: Call as needed    Lysbeth Penner 01/21/2023, 11:53 AM

## 2023-01-30 ENCOUNTER — Telehealth (HOSPITAL_COMMUNITY): Payer: Self-pay | Admitting: *Deleted

## 2023-01-30 NOTE — Telephone Encounter (Signed)
Hospital discharge follow-up call completed with Language Line interpreter, Riley Kill [phone]. Patient voiced no questions or concerns regarding her health at this time. EPDS=0. Patient asked about infant burping - voiced concerns that infant doesn't burp regularly. Patient reported formula feeding, breastfeeding, and pumping milk to give infant in a bottle. RN encouraged patient that infant may not burp with breastfeeding. Instructed patient to interrupt bottle feeding for periodic burping. Patient voiced no signs of distress for infant. Patient voiced no other questions or concerns regarding infant at this time. Patient reports infant sleeps in a crib on her back. RN reviewed ABCs of safe sleep. Patient verbalized understanding.  Erline Levine, RN, 01/30/23, [phone]

## 2023-02-28 ENCOUNTER — Ambulatory Visit: Payer: Self-pay | Admitting: Family Medicine

## 2023-02-28 NOTE — Progress Notes (Deleted)
    [place] Partum Visit Note  [name] [name] is a 37 y.o. [phone] female who presents for a postpartum visit. She is {1-10:13787} {time; units:18646} postpartum following a normal spontaneous vaginal delivery.  I have fully reviewed the prenatal and intrapartum course. The delivery was at [redacted]w[redacted]d gestational weeks.  Anesthesia: {anesthesia types:812}. Postpartum course has been ***. Baby is doing well***. Baby is feeding by {breastmilk/bottle:69}. Bleeding {vag bleed:12292}. Bowel function is {normal:32111}. Bladder function is {normal:32111}. Patient {is/is not:9024} sexually active. Contraception method is {contraceptive method:5051}. Postpartum depression screening: {gen negative/positive:315881}.   The pregnancy intention screening data noted above was reviewed. Potential methods of contraception were discussed. The patient elected to proceed with No data recorded.    Health Maintenance Due  Topic Date Due   COVID-19 Vaccine (1) Never done   PAP SMEAR-Modifier  04/02/2017   INFLUENZA VACCINE  07/19/2022    {Common ambulatory SmartLinks:19316}  Review of Systems {ros; complete:30496}  Objective:  LMP 04/11/2022    General:  {gen appearance:16600}   Breasts:  {desc; normal/abnormal/not indicated:14647}  Lungs: {lung exam:16931}  Heart:  {heart exam:5510}  Abdomen: {abdomen exam:16834}   Wound {Wound assessment:11097}  GU exam:  {desc; normal/abnormal/not indicated:14647}       Assessment:    There are no diagnoses linked to this encounter.  *** postpartum exam.   Plan:   Essential components of care per ACOG recommendations:  1.  Mood and well being: Patient with {gen negative/positive:315881} depression screening today. Reviewed local resources for support.  - Patient tobacco use? {tobacco use:25506}  - hx of drug use? {yes/no:25505}    2. Infant care and feeding:  -Patient currently breastmilk feeding? {yes/no:25502}  -Social determinants of health (SDOH) reviewed in  EPIC. No concerns***The following needs were identified***  3. Sexuality, contraception and birth spacing - Patient {DOES_DOES CZY:60630} want a pregnancy in the next year.  Desired family size is {NUMBER 1-10:22536} children.  - Reviewed reproductive life planning. Reviewed contraceptive methods based on pt preferences and effectiveness.  Patient desired {Up[employer] End Methods:24109} today.   - Discussed birth spacing of 18 months  4. Sleep and fatigue -Encouraged family/partner/community support of 4 hrs of uninterrupted sleep to help with mood and fatigue  5. Physical Recovery  - Discussed patients delivery and complications. She describes her labor as {description:25511} - Patient had a {CHL AMB DELIVERY:[phone]}. Patient had a {laceration:25518} laceration. Perineal healing reviewed. Patient expressed understanding - Patient has urinary incontinence? {yes/no:25515} - Patient {ACTION; IS/IS ZSW:[id number]} safe to resume physical and sexual activity  6.  Health Maintenance - HM due items addressed {Yes or If no, why not?:20788} - Last pap smear No results found for: "DIAGPAP" Pap smear {done:10129} at today's visit.  -Breast Cancer screening indicated? {indicated:25516}  7. Chronic Disease/Pregnancy Condition follow up: {Follow up:25499}  - PCP follow up  Amado Coe, [place] for [place]

## 2023-11-19 IMAGING — US US OB < 14 WEEKS - US OB TV
1 series · 15 of 28 positions shown · non-contrast
Comparison: None Available.

CLINICAL DATA: Vaginal bleeding

EXAM:
OBSTETRIC <14 WK US AND TRANSVAGINAL OB US
TECHNIQUE: Both transabdominal and transvaginal ultrasound examinations were
performed for complete evaluation of the gestation as well as the
maternal uterus, adnexal regions, and pelvic cul-de-sac.
Transvaginal technique was performed to assess early pregnancy.

[Series 1: us ob < 14 weeks - us ob tv · 15 of 50 slices shown]
[im 1/50]
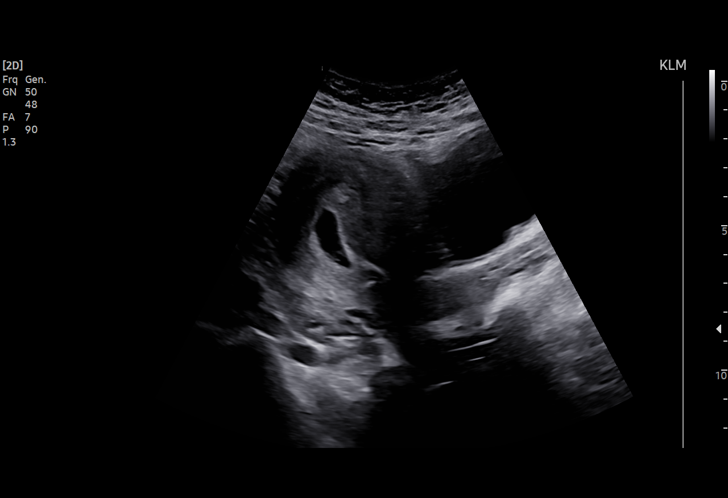
[im 4/50]
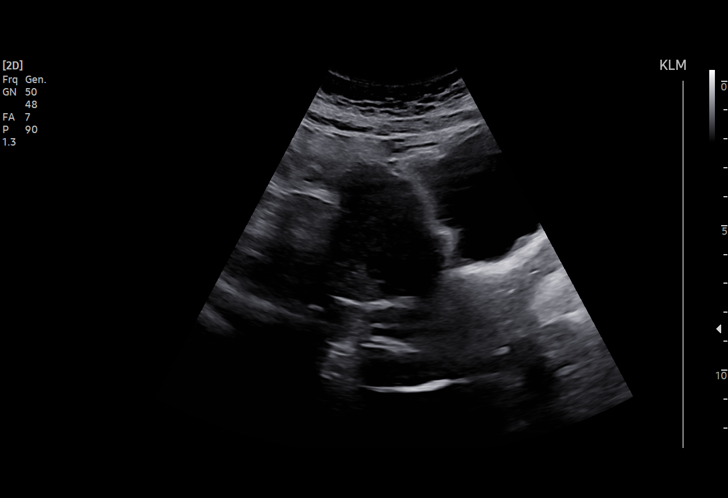
[im 8/50]
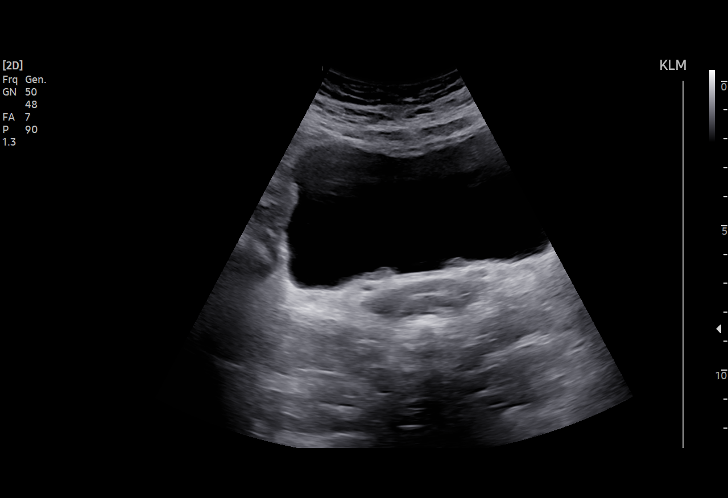
[im 11/50]
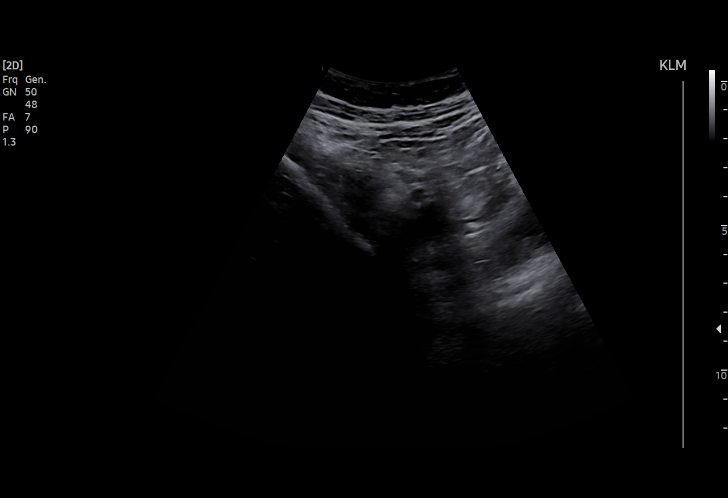
[im 15/50]
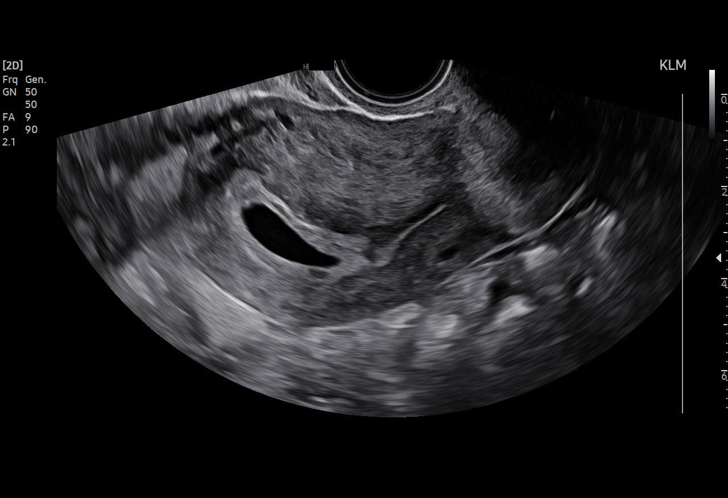
[im 19/50]
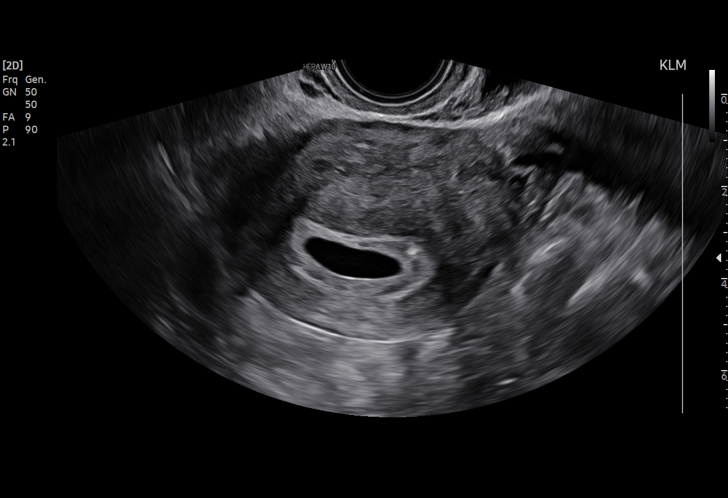
[im 22/50]
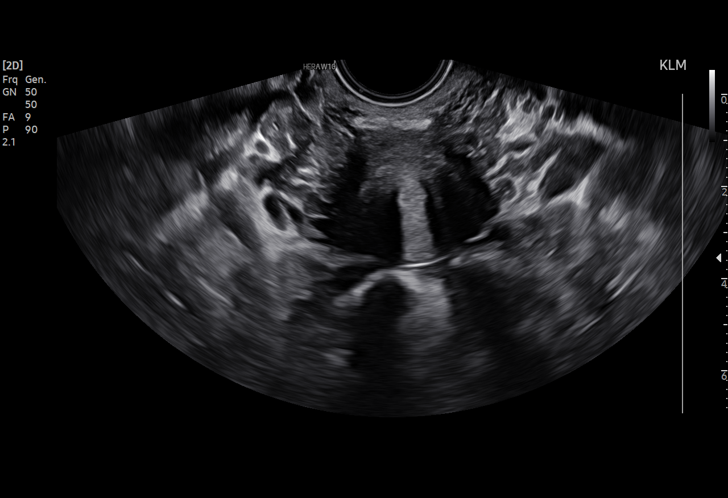
[im 26/50]
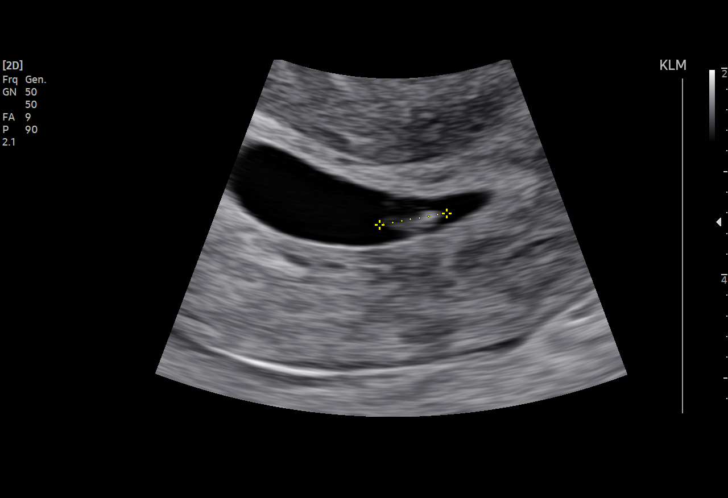
[im 28/50]
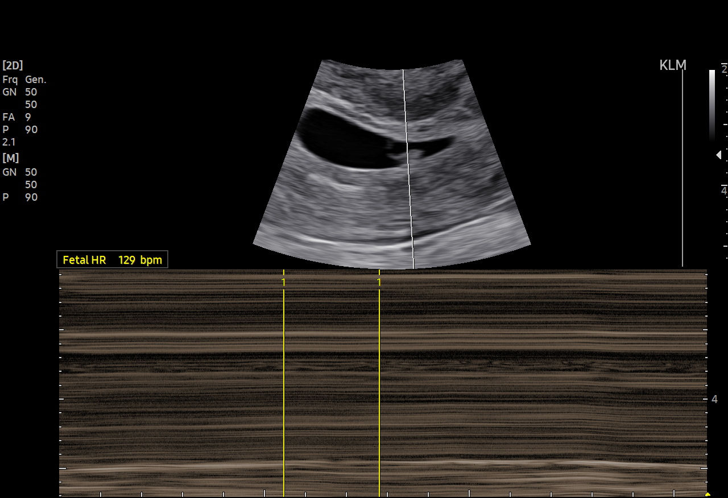
[im 31/50]
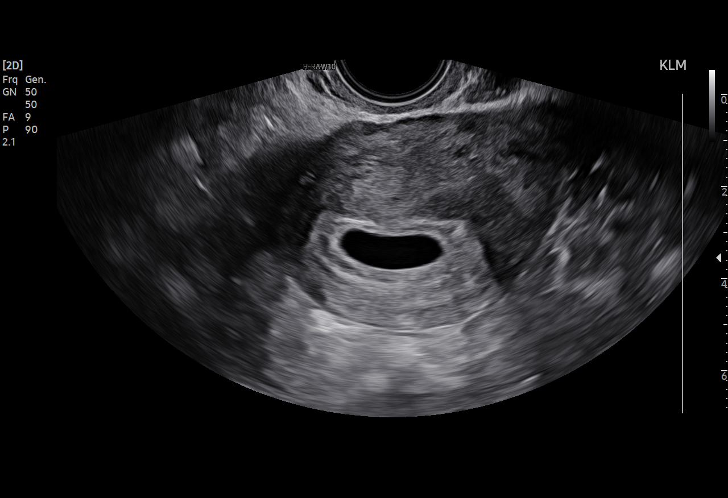
[im 35/50]
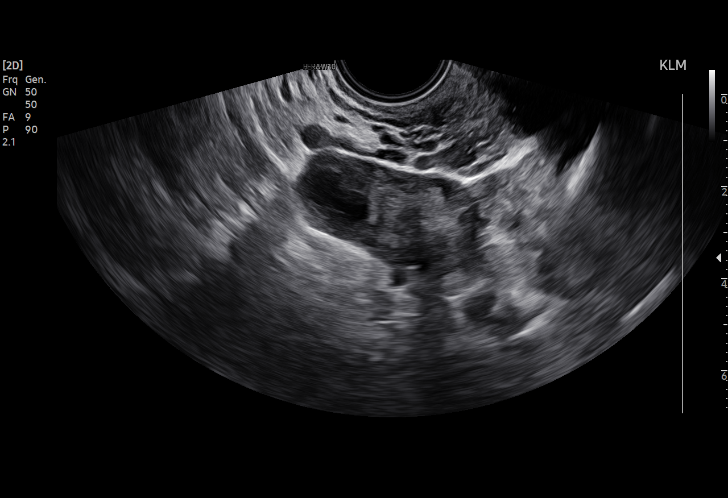
[im 39/50]
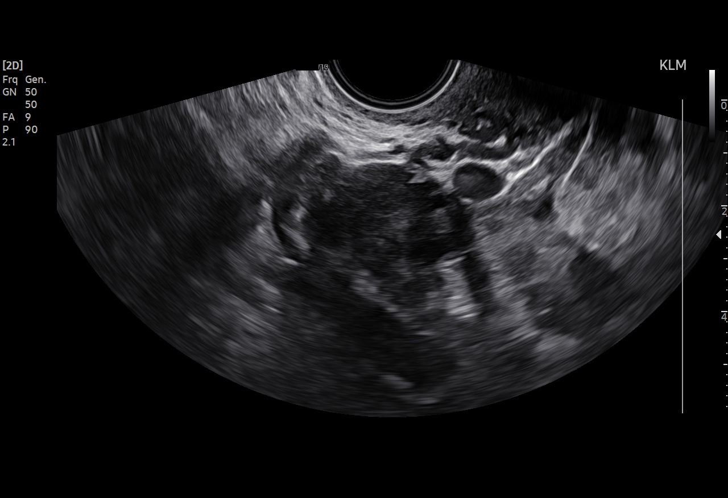
[im 42/50]
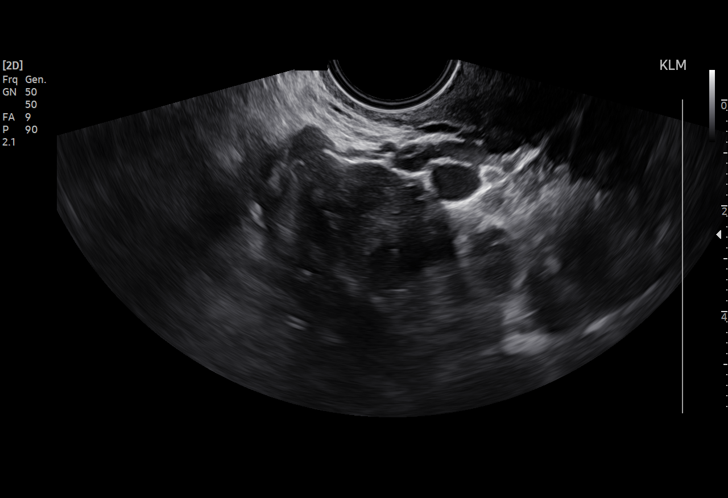
[im 46/50]
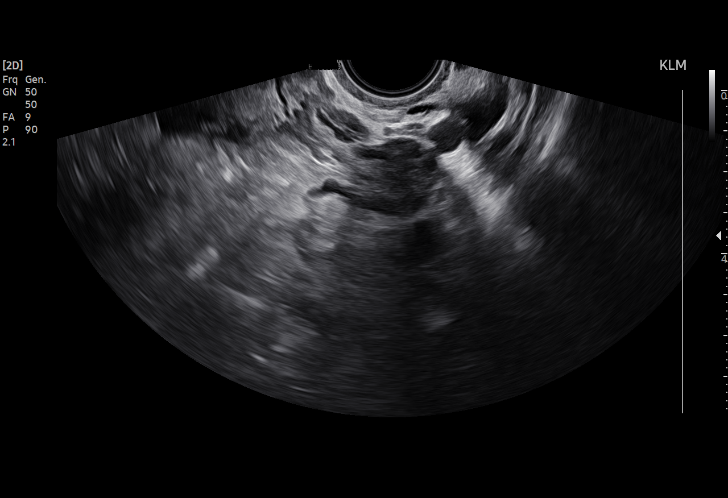
[im 50/50]
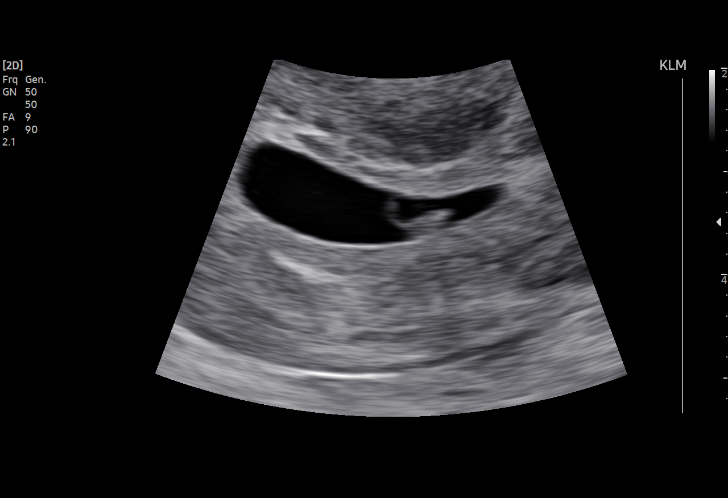

[15 of 28 positions shown; findings below may reference images not displayed]

FINDINGS: Intrauterine gestational sac: Single

Yolk sac:  Visualized.

Embryo:  Visualized.

Cardiac Activity: Visualized.

Heart Rate: 129 bpm

MSD:   mm    w     d

CRL:  5.4 mm   6 w   2 d                  US EDC: 01/19/2023

Subchorionic hemorrhage:  None visualized.

Maternal uterus/adnexae: No adnexal mass or free fluid.
IMPRESSION: Five week 4 day intrauterine pregnancy. Fetal heart rate 129 beats
per minute. No acute maternal findings.
